# Patient Record
Sex: Male | Born: 1950 | Race: Black or African American | Hispanic: No | Marital: Married | Smoking: Former smoker
Health system: Southern US, Community
[De-identification: ages and names within clinical notes are randomized; demographics above are authoritative.]

## PROBLEM LIST (undated history)

## (undated) DIAGNOSIS — J449 Chronic obstructive pulmonary disease, unspecified: Secondary | ICD-10-CM

## (undated) DIAGNOSIS — I10 Essential (primary) hypertension: Secondary | ICD-10-CM

## (undated) DIAGNOSIS — E559 Vitamin D deficiency, unspecified: Secondary | ICD-10-CM

## (undated) DIAGNOSIS — L2082 Flexural eczema: Secondary | ICD-10-CM

## (undated) DIAGNOSIS — I739 Peripheral vascular disease, unspecified: Secondary | ICD-10-CM

## (undated) HISTORY — DX: Vitamin D deficiency, unspecified: E55.9

## (undated) HISTORY — DX: Flexural eczema: L20.82

## (undated) HISTORY — DX: Chronic obstructive pulmonary disease, unspecified: J44.9

## (undated) HISTORY — DX: Peripheral vascular disease, unspecified: I73.9

## (undated) HISTORY — DX: Essential (primary) hypertension: I10

---

## 2000-07-29 ENCOUNTER — Emergency Department (HOSPITAL_COMMUNITY): Admission: EM | Admit: 2000-07-29 | Discharge: 2000-07-29 | Payer: Self-pay | Admitting: Emergency Medicine

## 2000-08-08 ENCOUNTER — Encounter: Payer: Self-pay | Admitting: Emergency Medicine

## 2000-08-08 ENCOUNTER — Emergency Department (HOSPITAL_COMMUNITY): Admission: EM | Admit: 2000-08-08 | Discharge: 2000-08-08 | Payer: Self-pay | Admitting: Emergency Medicine

## 2000-08-10 ENCOUNTER — Ambulatory Visit (HOSPITAL_COMMUNITY): Admission: RE | Admit: 2000-08-10 | Discharge: 2000-08-10 | Payer: Self-pay | Admitting: General Surgery

## 2000-08-10 ENCOUNTER — Encounter: Payer: Self-pay | Admitting: General Surgery

## 2009-07-21 ENCOUNTER — Emergency Department (HOSPITAL_COMMUNITY): Admission: EM | Admit: 2009-07-21 | Discharge: 2009-07-21 | Payer: Self-pay | Admitting: Emergency Medicine

## 2009-11-16 ENCOUNTER — Emergency Department (HOSPITAL_COMMUNITY): Admission: EM | Admit: 2009-11-16 | Discharge: 2009-11-16 | Payer: Self-pay | Admitting: Emergency Medicine

## 2010-08-06 LAB — BASIC METABOLIC PANEL
BUN: 14 mg/dL (ref 6–23)
CO2: 25 mEq/L (ref 19–32)
Calcium: 9.2 mg/dL (ref 8.4–10.5)
Chloride: 105 mEq/L (ref 96–112)
Creatinine, Ser: 1.06 mg/dL (ref 0.4–1.5)
GFR calc Af Amer: >60 mL/min (ref >60)
GFR calc non Af Amer: >60 mL/min (ref >60)
Glucose, Bld: 92 mg/dL (ref 70–99)
Potassium: 3.9 mEq/L (ref 3.5–5.1)
Sodium: 141 mEq/L (ref 135–145)

## 2010-08-06 LAB — DIFFERENTIAL
Basophils Absolute: 0 K/uL (ref 0.0–0.1)
Basophils Relative: 1 % (ref 0–1)
Eosinophils Absolute: 0 10*3/uL (ref 0.0–0.7)
Eosinophils Relative: 1 % (ref 0–5)
Lymphocytes Relative: 43 % (ref 12–46)
Lymphs Abs: 3.4 K/uL (ref 0.7–4.0)
Monocytes Absolute: 0.7 K/uL (ref 0.1–1.0)
Monocytes Relative: 9 % (ref 3–12)
Neutro Abs: 3.7 K/uL (ref 1.7–7.7)
Neutrophils Relative %: 46 % (ref 43–77)

## 2010-08-06 LAB — CBC
HCT: 43.1 % (ref 39.0–52.0)
Hemoglobin: 14.8 g/dL (ref 13.0–17.0)
MCHC: 34.2 g/dL (ref 30.0–36.0)
MCV: 94.6 fL (ref 78.0–100.0)
Platelets: 177 K/uL (ref 150–400)
RBC: 4.56 MIL/uL (ref 4.22–5.81)
RDW: 13.2 % (ref 11.5–15.5)
WBC: 7.9 K/uL (ref 4.0–10.5)

## 2010-08-06 LAB — D-DIMER, QUANTITATIVE: D-Dimer, Quant: 0.33 ug{FEU}/mL (ref 0.00–0.48)

## 2010-08-06 LAB — POCT CARDIAC MARKERS

## 2010-09-28 ENCOUNTER — Inpatient Hospital Stay (INDEPENDENT_AMBULATORY_CARE_PROVIDER_SITE_OTHER)
Admission: RE | Admit: 2010-09-28 | Discharge: 2010-09-28 | Disposition: A | Discharge status: Home / Self Care | Payer: Self-pay | Source: Ambulatory Visit | Attending: Family Medicine | Admitting: Family Medicine

## 2010-09-28 DIAGNOSIS — L259 Unspecified contact dermatitis, unspecified cause: Secondary | ICD-10-CM

## 2012-01-02 ENCOUNTER — Ambulatory Visit (INDEPENDENT_AMBULATORY_CARE_PROVIDER_SITE_OTHER): Payer: Self-pay | Admitting: Family Medicine

## 2012-01-02 ENCOUNTER — Ambulatory Visit (HOSPITAL_COMMUNITY)
Admission: RE | Admit: 2012-01-02 | Discharge: 2012-01-02 | Disposition: A | Discharge status: Home / Self Care | Payer: Self-pay | Source: Ambulatory Visit

## 2012-01-02 ENCOUNTER — Encounter: Payer: Self-pay | Admitting: Family Medicine

## 2012-01-02 ENCOUNTER — Ambulatory Visit (HOSPITAL_COMMUNITY)
Admission: RE | Admit: 2012-01-02 | Discharge: 2012-01-02 | Disposition: A | Discharge status: Home / Self Care | Payer: Self-pay | Source: Ambulatory Visit | Attending: Family Medicine | Admitting: Family Medicine

## 2012-01-02 VITALS — BP 158/90 | HR 100 | Temp 98.4°F | Ht 69.0 in | Wt 151.0 lb

## 2012-01-02 DIAGNOSIS — R634 Abnormal weight loss: Secondary | ICD-10-CM

## 2012-01-02 DIAGNOSIS — J411 Mucopurulent chronic bronchitis: Secondary | ICD-10-CM

## 2012-01-02 DIAGNOSIS — Z720 Tobacco use: Secondary | ICD-10-CM | POA: Insufficient documentation

## 2012-01-02 DIAGNOSIS — F17200 Nicotine dependence, unspecified, uncomplicated: Secondary | ICD-10-CM | POA: Insufficient documentation

## 2012-01-02 DIAGNOSIS — R079 Chest pain, unspecified: Secondary | ICD-10-CM

## 2012-01-02 DIAGNOSIS — I517 Cardiomegaly: Secondary | ICD-10-CM | POA: Insufficient documentation

## 2012-01-02 DIAGNOSIS — L309 Dermatitis, unspecified: Secondary | ICD-10-CM | POA: Insufficient documentation

## 2012-01-02 DIAGNOSIS — L259 Unspecified contact dermatitis, unspecified cause: Secondary | ICD-10-CM

## 2012-01-02 DIAGNOSIS — I10 Essential (primary) hypertension: Secondary | ICD-10-CM

## 2012-01-02 DIAGNOSIS — R05 Cough: Secondary | ICD-10-CM | POA: Insufficient documentation

## 2012-01-02 DIAGNOSIS — R059 Cough, unspecified: Secondary | ICD-10-CM | POA: Insufficient documentation

## 2012-01-02 LAB — CBC WITH DIFFERENTIAL/PLATELET
HCT: 40.3 % (ref 39.0–52.0)
Hemoglobin: 13.4 g/dL (ref 13.0–17.0)
Lymphocytes Relative: 18 % (ref 12–46)
Lymphs Abs: 1.8 10*3/uL (ref 0.7–4.0)
MCHC: 33.3 g/dL (ref 30.0–36.0)
Monocytes Absolute: 1.1 10*3/uL — ABNORMAL HIGH (ref 0.1–1.0)
Monocytes Relative: 11 % (ref 3–12)
Neutro Abs: 6.9 10*3/uL (ref 1.7–7.7)
RBC: 4.19 MIL/uL — ABNORMAL LOW (ref 4.22–5.81)
WBC: 9.9 10*3/uL (ref 4.0–10.5)

## 2012-01-02 LAB — POCT GLYCOSYLATED HEMOGLOBIN (HGB A1C): Hemoglobin A1C: 5

## 2012-01-02 LAB — COMPREHENSIVE METABOLIC PANEL
ALT: 24 U/L (ref 0–53)
Albumin: 3.6 g/dL (ref 3.5–5.2)
CO2: 27 mEq/L (ref 19–32)
Glucose, Bld: 102 mg/dL — ABNORMAL HIGH (ref 70–99)
Potassium: 3.8 mEq/L (ref 3.5–5.3)
Sodium: 139 mEq/L (ref 135–145)
Total Protein: 6.7 g/dL (ref 6.0–8.3)

## 2012-01-02 LAB — TSH: TSH: 2.206 u[IU]/mL (ref 0.350–4.500)

## 2012-01-02 MED ORDER — TRIAMCINOLONE ACETONIDE 0.5 % EX OINT: TOPICAL_OINTMENT | Freq: Two times a day (BID) | CUTANEOUS | Status: DC

## 2012-01-02 MED ORDER — LISINOPRIL-HYDROCHLOROTHIAZIDE 10-12.5 MG PO TABS: 1.0000 | ORAL_TABLET | Freq: Every day | ORAL | Status: DC

## 2012-01-02 NOTE — Progress Notes (Signed)
Lawrence Sanford is a 61 y.o. who presents today for chronic cough x 1 month.   He states he has had a purulent cough x 1 month, that changed from clear to yellow at that time.  No increase in sputum production at that time.  No fever chills, but is having night sweats (drenching) x 3 months.  Has also had a 20 lbs weight loss in the past 2 years.  Has been smoking for about 40 yrs x 1 ppd.  Does deny Hemoptysis   Is having CP with coughing, but denies CP at rest or with ambulation.  Denies radiation into arm/jaw, nausea, vomiting, or having lightheaded, or dizziness, changes in vision, blurred vision, diplopia.  Denies HA, rhinorrhea, diarrhea, hematemesis, melena.   Does have Hx of PAD and gets claudication with about 2 blocks of activity.  Has been ongoing for over 10 years now, but has not seen a doctor for this other than an ED trip.   Also is having chronic eczema on the extensor surface for around one year now.  Is having worsening pruritis over the past month, along with spreading of his eczema.  Has tried emollients and hydrocortisone which has worked in the past but is getting little relief at the moment.    Past Medical History  Diagnosis Date  . PAD (peripheral artery disease)     History   Social History  . Marital Status: Married    Spouse Name: N/A    Number of Children: N/A  . Years of Education: N/A   Occupational History  . Not on file.   Social History Main Topics  . Smoking status: Current Everyday Smoker -- 1.0 packs/day for 40 years    Types: Cigarettes  . Smokeless tobacco: Not on file  . Alcohol Use: 1.5 oz/week    3 drink(s) per week  . Drug Use: No  . Sexually Active: Yes -- Male partner(s)   Other Topics Concern  . Not on file   Social History Narrative  . No narrative on file    Family History  Problem Relation Age of Onset  . Diabetes Mother   . Hypertension Mother   . Diabetes Father   . Alcohol abuse Father   . Hypertension Father   .  Diabetes Sister   . Hypertension Sister   . Stroke Brother   . Hypertension Brother     Current outpatient prescriptions:triamcinolone ointment (KENALOG) 0.5 %, Apply topically 2 (two) times daily., Disp: 30 g, Rfl: 0  Review of Systems - Per HPI  Physical Exam Filed Vitals:   01/02/12 1042  BP: 158/90  Pulse:   Temp:     Gen: NAD, Thin, cachectic looking HEENT: PERLA, EOMI, [place]/AT Neck: no JVD, thyroid supple  Cardio: irregular rhythm and rate , no murmurs, gallops, rub Lungs: Decreased BS B/L, no wheezing Abd: NABS, soft nontender nondistended MSK: ROM normal  Neuro: CN 2-12 intact, MS 5/5 B/L UE and LE, +2 patellar and achilles relfex b/l  Extremities: +1/4 pulses DP B/L, Clubbing B/L digits Psych: AAO x 3   No results found for this or any previous visit (from the past 72 hour(s)).

## 2012-01-02 NOTE — Assessment & Plan Note (Signed)
Will continue using creams as well as triamcinalone acetate 0.5% ointment (10 dollar ).  Will apply bid x 2 weeks and see how you are doing at your next visit.

## 2012-01-02 NOTE — Assessment & Plan Note (Signed)
Will get CXR as well as CBCAD (at return due to [facility] Card at that time) for possible Lung CA.  Most likely has underlying COPD (will need PFT and will look at CXR after it being done) for classification by Gold Standard.  Will treat today with prednisone 50 mg and azithromycin for possible COPD exacerbation ( Would like to give as well Spiriva/Atrovent and albuterol but pt does not have insurance and are both extremely expensive.

## 2012-01-02 NOTE — Patient Instructions (Signed)
Chronic Obstructive Pulmonary Disease Chronic obstructive pulmonary disease (COPD) is a condition in which airflow from the lungs is restricted. The lungs can never return to normal, but there are measures you can take which will improve them and make you feel better. CAUSES   Smoking.   Exposure to secondhand smoke.   Breathing in irritants (pollution, cigarette smoke, strong smells, aerosol sprays, paint fumes).   History of lung infections.  TREATMENT  Treatment focuses on making you comfortable (supportive care). Your caregiver may prescribe medications (inhaled or pills) to help improve your breathing. HOME CARE INSTRUCTIONS   If you smoke, stop smoking.   Avoid exposure to smoke, chemicals, and fumes that aggravate your breathing.   Take antibiotic medicines as directed by your caregiver.   Avoid medicines that dry up your system and slow down the elimination of secretions (antihistamines and cough syrups). This decreases respiratory capacity and may lead to infections.   Drink enough water and fluids to keep your urine clear or pale yellow. This loosens secretions.   Use humidifiers at home and at your bedside if they do not make breathing difficult.   Receive all protective vaccines your caregiver suggests, especially pneumococcal and influenza.   Use home oxygen as suggested.   Stay active. Exercise and physical activity will help maintain your ability to do things you want to do.   Eat a healthy diet.  SEEK MEDICAL CARE IF:   You develop pus-like mucus (sputum).   Breathing is more labored or exercise becomes difficult to do.   You are running out of the medicine you take for your breathing.  SEEK IMMEDIATE MEDICAL CARE IF:   You have a rapid heart rate.   You have agitation, confusion, tremors, or are in a stupor (family members may need to observe this).   It becomes difficult to breathe.   You develop chest pain.   You have a fever.  MAKE SURE YOU:    Understand these instructions.   Will watch your condition.   Will get help right away if you are not doing well or get worse.  Document Released: 02/06/2005 Document Revised: 04/18/2011 Document Reviewed: 06/29/2010 [facility] Patient Information 2012 [place], [place].  Smoking Cessation This document explains the best ways for you to quit smoking and new treatments to help. It lists new medicines that can double or triple your chances of quitting and quitting for good. It also considers ways to avoid relapses and concerns you may have about quitting, including weight gain. NICOTINE: A POWERFUL ADDICTION If you have tried to quit smoking, you know how hard it can be. It is hard because nicotine is a very addictive drug. For some people, it can be as addictive as heroin or cocaine. Usually, people make 2 or 3 tries, or more, before finally being able to quit. Each time you try to quit, you can learn about what helps and what hurts. Quitting takes hard work and a lot of effort, but you can quit smoking. QUITTING SMOKING IS ONE OF THE MOST IMPORTANT THINGS YOU WILL EVER DO.  You will live longer, feel better, and live better.   The impact on your body of quitting smoking is felt almost immediately:   Within 20 minutes, blood pressure decreases. Pulse returns to its normal level.   After 8 hours, carbon monoxide levels in the blood return to normal. Oxygen level increases.   After 24 hours, chance of heart attack starts to decrease. Breath, hair, and body stop  smelling like smoke.   After 48 hours, damaged nerve endings begin to recover. Sense of taste and smell improve.   After 72 hours, the body is virtually free of nicotine. Bronchial tubes relax and breathing becomes easier.   After 2 to 12 weeks, lungs can hold more air. Exercise becomes easier and circulation improves.   Quitting will reduce your risk of having a heart attack, stroke, cancer, or lung disease:   After 1 year, the  risk of coronary heart disease is cut in half.   After 5 years, the risk of stroke falls to the same as a nonsmoker.   After 10 years, the risk of lung cancer is cut in half and the risk of other cancers decreases significantly.   After 15 years, the risk of coronary heart disease drops, usually to the level of a nonsmoker.   If you are pregnant, quitting smoking will improve your chances of having a healthy baby.   The people you live with, especially your children, will be healthier.   You will have extra money to spend on things other than cigarettes.  FIVE KEYS TO QUITTING Studies have shown that these 5 steps will help you quit smoking and quit for good. You have the best chances of quitting if you use them together: 1. Get ready.  2. Get support and encouragement.  3. Learn new skills and behaviors.  4. Get medicine to reduce your nicotine addiction and use it correctly.  5. Be prepared for relapse or difficult situations. Be determined to continue trying to quit, even if you do not succeed at first.  1. GET READY  Set a quit date.   Change your environment.   Get rid of ALL cigarettes, ashtrays, matches, and lighters in your home, car, and place of work.   Do not let people smoke in your home.   Review your past attempts to quit. Think about what worked and what did not.   Once you quit, do not smoke. NOT EVEN A PUFF!  2. GET SUPPORT AND ENCOURAGEMENT Studies have shown that you have a better chance of being successful if you have help. You can get support in many ways.  Tell your family, friends, and coworkers that you are going to quit and need their support. Ask them not to smoke around you.   Talk to your caregivers (doctor, dentist, nurse, pharmacist, psychologist, and/or smoking counselor).   Get individual, group, or telephone counseling and support. The more counseling you have, the better your chances are of quitting. Programs are available at [employer]  and health centers. Call your local health department for information about programs in your area.   Spiritual beliefs and practices may help some smokers quit.   Quit meters are Photographer that keep track of quit statistics, such as amount of "quit-time," cigarettes not smoked, and money saved.   Many smokers find one or more of the many self-help books available useful in helping them quit and stay off tobacco.  3. LEARN NEW SKILLS AND BEHAVIORS  Try to distract yourself from urges to smoke. Talk to someone, go for a walk, or occupy your time with a task.   When you first try to quit, change your routine. Take a different route to work. Drink tea instead of coffee. Eat breakfast in a different place.   Do something to reduce your stress. Take a hot bath, exercise, or read a book.   Plan something  enjoyable to do every day. Reward yourself for not smoking.   Explore interactive web-based programs that specialize in helping you quit.  4. GET MEDICINE AND USE IT CORRECTLY Medicines can help you stop smoking and decrease the urge to smoke. Combining medicine with the above behavioral methods and support can quadruple your chances of successfully quitting smoking. The U.S. Food and Drug Administration (FDA) has approved 7 medicines to help you quit smoking. These medicines fall into 3 categories.  Nicotine replacement therapy (delivers nicotine to your body without the negative effects and risks of smoking):   Nicotine gum: Available over-the-counter.   Nicotine lozenges: Available over-the-counter.   Nicotine inhaler: Available by prescription.   Nicotine nasal spray: Available by prescription.   Nicotine skin patches (transdermal): Available by prescription and over-the-counter.   Antidepressant medicine (helps people abstain from smoking, but how this works is unknown):   Bupropion sustained-release (SR) tablets: Available by prescription.    Nicotinic receptor partial agonist (simulates the effect of nicotine in your brain):   Varenicline tartrate tablets: Available by prescription.   Ask your caregiver for advice about which medicines to use and how to use them. Carefully read the information on the package.   Everyone who is trying to quit may benefit from using a medicine. If you are pregnant or trying to become pregnant, nursing an infant, you are under age 37, or you smoke fewer than 10 cigarettes per day, talk to your caregiver before taking any nicotine replacement medicines.   You should stop using a nicotine replacement product and call your caregiver if you experience nausea, dizziness, weakness, vomiting, fast or irregular heartbeat, mouth problems with the lozenge or gum, or redness or swelling of the skin around the patch that does not go away.   Do not use any other product containing nicotine while using a nicotine replacement product.   Talk to your caregiver before using these products if you have diabetes, heart disease, asthma, stomach ulcers, you had a recent heart attack, you have high blood pressure that is not controlled with medicine, a history of irregular heartbeat, or you have been prescribed medicine to help you quit smoking.  5. BE PREPARED FOR RELAPSE OR DIFFICULT SITUATIONS  Most relapses occur within the first 3 months after quitting. Do not be discouraged if you start smoking again. Remember, most people try several times before they finally quit.   You may have symptoms of withdrawal because your body is used to nicotine. You may crave cigarettes, be irritable, feel very hungry, cough often, get headaches, or have difficulty concentrating.   The withdrawal symptoms are only temporary. They are strongest when you first quit, but they will go away within 10 to 14 days.  Here are some difficult situations to watch for:  Alcohol. Avoid drinking alcohol. Drinking lowers your chances of successfully  quitting.   Caffeine. Try to reduce the amount of caffeine you consume. It also lowers your chances of successfully quitting.   Other smokers. Being around smoking can make you want to smoke. Avoid smokers.   Weight gain. Many smokers will gain weight when they quit, usually less than 10 pounds. Eat a healthy diet and stay active. Do not let weight gain distract you from your main goal, quitting smoking. Some medicines that help you quit smoking may also help delay weight gain. You can always lose the weight gained after you quit.   Bad mood or depression. There are a lot of ways to improve your  mood other than smoking.  If you are having problems with any of these situations, talk to your caregiver. SPECIAL SITUATIONS AND CONDITIONS Studies suggest that everyone can quit smoking. Your situation or condition can give you a special reason to quit.  Pregnant women/new mothers: By quitting, you protect your baby's health and your own.   Hospitalized patients: By quitting, you reduce health problems and help healing.   Heart attack patients: By quitting, you reduce your risk of a second heart attack.   Lung, head, and neck cancer patients: By quitting, you reduce your chance of a second cancer.   Parents of children and adolescents: By quitting, you protect your children from illnesses caused by secondhand smoke.  QUESTIONS TO THINK ABOUT Think about the following questions before you try to stop smoking. You may want to talk about your answers with your caregiver.  Why do you want to quit?   If you tried to quit in the past, what helped and what did not?   What will be the most difficult situations for you after you quit? How will you plan to handle them?   Who can help you through the tough times? Your family? Friends? Caregiver?   What pleasures do you get from smoking? What ways can you still get pleasure if you quit?  Here are some questions to ask your caregiver:  How can you  help me to be successful at quitting?   What medicine do you think would be best for me and how should I take it?   What should I do if I need more help?   What is smoking withdrawal like? How can I get information on withdrawal?  Quitting takes hard work and a lot of effort, but you can quit smoking. FOR MORE INFORMATION  Smokefree.gov (http://www.davis-sullivan.com/) provides free, accurate, evidence-based information and professional assistance to help support the immediate and long-term needs of people trying to quit smoking. Document Released: 04/23/2001 Document Revised: 04/18/2011 Document Reviewed: 02/13/2009 [facility] Patient Information 2012 [place], [place].

## 2012-01-02 NOTE — Assessment & Plan Note (Signed)
Most likely secondary to chronic tobacco abuse as has LAE as well on EKG.  Recommended started aspirin as pt was previously dx with PAD as well.

## 2012-01-02 NOTE — Assessment & Plan Note (Signed)
Will start on HCTZ/Lisinopril today and recheck him in a week.  At that point, will go over labs with him for underlying issues.

## 2012-01-02 NOTE — Assessment & Plan Note (Signed)
Concern for Lung Ca.  Work up for this.  Will get HIV testing at next visit after pt has [employer]

## 2012-01-02 NOTE — Assessment & Plan Note (Signed)
Recommended Nicorette gum and counseled about saving money for vaca in the future.  Start slow and will recheck at next vsiit to see how he is doing.

## 2012-01-03 ENCOUNTER — Telehealth: Payer: Self-pay | Admitting: Family Medicine

## 2012-01-03 MED ORDER — TRIAMCINOLONE ACETONIDE 0.5 % EX CREA
TOPICAL_CREAM | Freq: Two times a day (BID) | CUTANEOUS | Status: AC
Start: 2012-01-03 — End: 2013-01-02

## 2012-01-03 NOTE — Telephone Encounter (Signed)
Will need to know which [facility] this was called to .

## 2012-01-03 NOTE — Telephone Encounter (Signed)
Wife is calling because the cost of the cream that was prescribed is too high for them.  She said that she was told that it would be on the $4 plan.  She doesn't know what to do.

## 2012-01-03 NOTE — Telephone Encounter (Addendum)
Spoke with Lawrence Sanford and she states cream was going to cost $30.00 and they cannot afford. States when MD sent in told them it would cost $10.00 but cost $30.00. Please prescribe something else cheaper or on $4.00 plan.  Went onto [employer] for [employer].  The triamcinolone cream 0.5% cream is on the list and has been e-Rx there.  Please let pt know that this has been called in and according to [facility] $4 Rx list, is one of the medications.   Thanks, [facility]

## 2012-01-06 ENCOUNTER — Ambulatory Visit: Payer: Self-pay | Admitting: Family Medicine

## 2012-01-21 ENCOUNTER — Ambulatory Visit (INDEPENDENT_AMBULATORY_CARE_PROVIDER_SITE_OTHER): Payer: Self-pay | Admitting: Family Medicine

## 2012-01-21 ENCOUNTER — Encounter: Payer: Self-pay | Admitting: Family Medicine

## 2012-01-21 VITALS — BP 170/90 | HR 82 | Temp 98.3°F | Ht 69.0 in | Wt 155.0 lb

## 2012-01-21 DIAGNOSIS — F17200 Nicotine dependence, unspecified, uncomplicated: Secondary | ICD-10-CM

## 2012-01-21 DIAGNOSIS — R634 Abnormal weight loss: Secondary | ICD-10-CM

## 2012-01-21 DIAGNOSIS — Z720 Tobacco use: Secondary | ICD-10-CM

## 2012-01-21 DIAGNOSIS — J411 Mucopurulent chronic bronchitis: Secondary | ICD-10-CM

## 2012-01-21 DIAGNOSIS — I10 Essential (primary) hypertension: Secondary | ICD-10-CM

## 2012-01-21 NOTE — Assessment & Plan Note (Signed)
Gained 4 lbs since last visit.  Will continue to monitor

## 2012-01-21 NOTE — Assessment & Plan Note (Signed)
Pt still having cough but has changed from yellow production to clear-white and has decreased in amount.  CXR done does not show infiltrate or masses.  CBC does not show elevation in white count.  Will await for pt to get [facility] and would like to continuing to follow.  Would hold off on the CT scan unless pt starts to have hemoptysis or consistent fevers.  Pt will need Colonoscopy in the future as well to r/o colon CA with possible mets.

## 2012-01-21 NOTE — Progress Notes (Signed)
Lawrence Sanford is a 61 y.o. who presents today for f/u of his HTN, weight loss, and smoking status.   His BP is 170/90 today and he is taking his medication as directed.  No dizziness, blurred vision, diplopia.  Is having increased frequency of urination.  Is still having cough.  Changed in consistency to thin with white color from thick with yellow color.  Decreased total production as well.  Did gain 4 lbs since last visit and his night sweats are 2 x per week instead of every night.   Smoking status is still smoking 1/2 ppd.  Cut in 1/2 from his ppd for 40 yrs.    Past Medical History  Diagnosis Date  . PAD (peripheral artery disease)     History   Social History  . Marital Status: Married    Spouse Name: N/A    Number of Children: N/A  . Years of Education: N/A   Occupational History  . Not on file.   Social History Main Topics  . Smoking status: Current Everyday Smoker -- 0.5 packs/day for 40 years    Types: Cigarettes  . Smokeless tobacco: Not on file  . Alcohol Use: 1.5 oz/week    3 drink(s) per week  . Drug Use: No  . Sexually Active: Yes -- Male partner(s)   Other Topics Concern  . Not on file   Social History Narrative  . No narrative on file    Family History  Problem Relation Age of Onset  . Diabetes Mother   . Hypertension Mother   . Diabetes Father   . Alcohol abuse Father   . Hypertension Father   . Diabetes Sister   . Hypertension Sister   . Stroke Brother   . Hypertension Brother     Current outpatient prescriptions:lisinopril-hydrochlorothiazide (PRINZIDE,ZESTORETIC) 10-12.5 MG per tablet, Take 1 tablet by mouth daily., Disp: 90 tablet, Rfl: 0;  triamcinolone cream (KENALOG) 0.5 %, Apply topically 2 (two) times daily., Disp: 15 g, Rfl: 11  Patient Information Form: Screening and ROS   Physical Exam Filed Vitals:   01/21/12 1410  BP: 170/90  Pulse: 82  Temp: 98.3 F (36.8 C)    Gen: NAD, Thin HEENT: [place]/AT Neck: no JVD, no  lymphadenopathy  Cardio: RRR, No murmurs/gallops/rubs Lungs: Increased expiration phase Abd: NABS, soft nontender nondistended MSK: ROM normal, + Clubbing of digits B/L Neuro: CN 2-12 intact Psych: AAO x 3    Chemistry      Component Value Date/Time   NA 139 01/02/2012 1153   K 3.8 01/02/2012 1153   CL 103 01/02/2012 1153   CO2 27 01/02/2012 1153   BUN 6 01/02/2012 1153   CREATININE 0.86 01/02/2012 1153   CREATININE 1.06 07/21/2009 1218      Component Value Date/Time   CALCIUM 9.4 01/02/2012 1153   ALKPHOS 96 01/02/2012 1153   AST 43* 01/02/2012 1153   ALT 24 01/02/2012 1153   BILITOT 1.0 01/02/2012 1153     Lab Results  Component Value Date   TSH 2.206 01/02/2012

## 2012-01-21 NOTE — Assessment & Plan Note (Signed)
Has decreased his tobacco use to 1/2 ppd.  Goal is to get to 7-8 cigarettes by next month's visit.

## 2012-01-21 NOTE — Assessment & Plan Note (Signed)
BP elevated to 170/90 today.  Has been taking Zestoretic 10-12.5 and will increase this to two pills in the AM.  Will see back in a month to reassess. HE would be a good canidate for Norvasc 5-10 mg qd.

## 2012-01-21 NOTE — Patient Instructions (Signed)
We saw you today for your blood pressure and your cough.  Your blood pressure is still high despite taking the zestoretic.  We will increase this so that you are taking two of these pills.    We will see you back in 1 month and reassess how your smoking is doing as well.

## 2012-02-17 ENCOUNTER — Ambulatory Visit: Payer: Self-pay | Admitting: Family Medicine

## 2012-09-23 ENCOUNTER — Other Ambulatory Visit: Payer: Self-pay | Admitting: *Deleted

## 2012-09-23 DIAGNOSIS — I10 Essential (primary) hypertension: Secondary | ICD-10-CM

## 2012-09-23 MED ORDER — LISINOPRIL-HYDROCHLOROTHIAZIDE 10-12.5 MG PO TABS: 1.0000 | ORAL_TABLET | Freq: Every day | ORAL | Status: DC

## 2012-11-16 ENCOUNTER — Emergency Department (INDEPENDENT_AMBULATORY_CARE_PROVIDER_SITE_OTHER)
Admission: EM | Admit: 2012-11-16 | Discharge: 2012-11-16 | Disposition: A | Discharge status: Home / Self Care | Payer: Self-pay | Source: Home / Self Care

## 2012-11-16 DIAGNOSIS — L259 Unspecified contact dermatitis, unspecified cause: Secondary | ICD-10-CM

## 2012-11-16 DIAGNOSIS — L309 Dermatitis, unspecified: Secondary | ICD-10-CM

## 2012-11-16 DIAGNOSIS — I10 Essential (primary) hypertension: Secondary | ICD-10-CM

## 2012-11-16 MED ORDER — TRIAMCINOLONE ACETONIDE 40 MG/ML IJ SUSP
40.0000 mg | Freq: Once | INTRAMUSCULAR | Status: AC
  Administered 2012-11-16: 40 mg via INTRAMUSCULAR

## 2012-11-16 MED ORDER — LISINOPRIL-HYDROCHLOROTHIAZIDE 10-12.5 MG PO TABS: 1.0000 | ORAL_TABLET | Freq: Every day | ORAL | Status: DC

## 2012-11-16 MED ORDER — TRIAMCINOLONE ACETONIDE 0.1 % EX CREA: TOPICAL_CREAM | Freq: Two times a day (BID) | CUTANEOUS | Status: DC

## 2012-11-16 MED ORDER — TRIAMCINOLONE ACETONIDE 40 MG/ML IJ SUSP
INTRAMUSCULAR | Status: AC
  Filled 2012-11-16: qty 1

## 2012-11-16 NOTE — ED Notes (Signed)
Patient states would like a refill on eczema cream and blood pressure medication.

## 2012-11-16 NOTE — ED Notes (Signed)
Patient tolerated medication well.

## 2012-11-16 NOTE — ED Provider Notes (Signed)
History    CSN: [id number] Arrival date & time 11/16/12  4098  None    Chief Complaint  Patient presents with  . Eczema  . Hypertension   (Consider location/radiation/quality/duration/timing/severity/associated sxs/prior Treatment) HPI Comments: 62 year old now here because he does not have a doctor and is requesting refills on chronic medication. He has a history of hypertension and eczema. Is requesting refills on Zestoretic and triamcinolone cream.  Past Medical History  Diagnosis Date  . PAD (peripheral artery disease)   . Hypertension    No past surgical history on file. Family History  Problem Relation Age of Onset  . Diabetes Mother   . Hypertension Mother   . Diabetes Father   . Alcohol abuse Father   . Hypertension Father   . Diabetes Sister   . Hypertension Sister   . Stroke Brother   . Hypertension Brother    History  Substance Use Topics  . Smoking status: Current Every Day Smoker -- 0.50 packs/day for 40 years    Types: Cigarettes  . Smokeless tobacco: Not on file  . Alcohol Use: 1.5 oz/week    3 drink(s) per week    Review of Systems  Constitutional: Negative for fever, diaphoresis and activity change.  HENT: Negative.   Respiratory: Negative.   Cardiovascular: Negative for chest pain and leg swelling.  Gastrointestinal: Negative.   Skin: Positive for rash.  Neurological: Negative.     Allergies  Review of patient's allergies indicates no known allergies.  Home Medications   Current Outpatient Rx  Name  Route  Sig  Dispense  Refill  . lisinopril-hydrochlorothiazide (PRINZIDE,ZESTORETIC) 10-12.5 MG per tablet   Oral   Take 1 tablet by mouth daily.   90 tablet   0   . lisinopril-hydrochlorothiazide (PRINZIDE,ZESTORETIC) 10-12.5 MG per tablet   Oral   Take 1 tablet by mouth daily.   30 tablet   0   . triamcinolone cream (KENALOG) 0.1 %   Topical   Apply topically 2 (two) times daily. Apply for 2 weeks. May use on face   30 g   0   . triamcinolone cream (KENALOG) 0.5 %   Topical   Apply topically 2 (two) times daily.   15 g   11    BP 147/69  Pulse 80  Temp(Src) 98.3 F (36.8 C) (Oral)  Resp 16  SpO2 100% Physical Exam  Nursing note and vitals reviewed. Constitutional: He is oriented to person, place, and time. He appears well-developed and well-nourished. No distress.  HENT:  Mouth/Throat: Oropharynx is clear and moist.  Neck: Normal range of motion. Neck supple.  Cardiovascular: Normal rate and normal heart sounds.   Pulmonary/Chest: Effort normal and breath sounds normal. No respiratory distress.  Musculoskeletal: He exhibits no edema and no tenderness.  Neurological: He is alert and oriented to person, place, and time. He exhibits normal muscle tone.  Skin: Rash noted.  Eczematoid rash covering many body surface areas to include bilateral upper extremities lesser to the face partly to the lower extremities and trunk.  Psychiatric: He has a normal mood and affect.    ED Course  Procedures (including critical care time) Labs Reviewed - No data to display No results found. 1. HTN (hypertension)   2. Eczema     MDM  Zestoretic 10/12.5 mg daily for blood pressure #30 no refills Kenalog 40 mg IM Triamcinolone cream apply to affected areas twice a day. Call the above number today to establish with a physician.  Hayden Rasmussen, NP 11/16/12 4540  Hayden Rasmussen, NP 11/16/12 [phone]

## 2012-11-16 NOTE — ED Provider Notes (Signed)
Medical screening examination/treatment/procedure(s) were performed by non-physician practitioner and as supervising physician I was immediately available for consultation/collaboration.  Leslee Home, M.D.  Reuben Likes, MD 11/16/12 [phone]

## 2015-07-14 ENCOUNTER — Emergency Department (HOSPITAL_COMMUNITY)
Admission: EM | Admit: 2015-07-14 | Discharge: 2015-07-14 | Disposition: A | Discharge status: Home / Self Care | Payer: Medicaid Other | Attending: Emergency Medicine | Admitting: Emergency Medicine

## 2015-07-14 ENCOUNTER — Encounter (HOSPITAL_COMMUNITY): Payer: Self-pay | Admitting: Emergency Medicine

## 2015-07-14 DIAGNOSIS — T22231A Burn of second degree of right upper arm, initial encounter: Secondary | ICD-10-CM | POA: Insufficient documentation

## 2015-07-14 DIAGNOSIS — Y9389 Activity, other specified: Secondary | ICD-10-CM | POA: Insufficient documentation

## 2015-07-14 DIAGNOSIS — T22031A Burn of unspecified degree of right upper arm, initial encounter: Secondary | ICD-10-CM | POA: Diagnosis present

## 2015-07-14 DIAGNOSIS — X19XXXA Contact with other heat and hot substances, initial encounter: Secondary | ICD-10-CM | POA: Insufficient documentation

## 2015-07-14 DIAGNOSIS — Z23 Encounter for immunization: Secondary | ICD-10-CM | POA: Diagnosis not present

## 2015-07-14 DIAGNOSIS — T22212A Burn of second degree of left forearm, initial encounter: Secondary | ICD-10-CM | POA: Insufficient documentation

## 2015-07-14 DIAGNOSIS — Y9289 Other specified places as the place of occurrence of the external cause: Secondary | ICD-10-CM | POA: Insufficient documentation

## 2015-07-14 DIAGNOSIS — Y998 Other external cause status: Secondary | ICD-10-CM | POA: Diagnosis not present

## 2015-07-14 DIAGNOSIS — F17210 Nicotine dependence, cigarettes, uncomplicated: Secondary | ICD-10-CM | POA: Diagnosis not present

## 2015-07-14 DIAGNOSIS — T2123XA Burn of second degree of upper back, initial encounter: Secondary | ICD-10-CM | POA: Diagnosis not present

## 2015-07-14 DIAGNOSIS — Z79899 Other long term (current) drug therapy: Secondary | ICD-10-CM | POA: Diagnosis not present

## 2015-07-14 DIAGNOSIS — T300 Burn of unspecified body region, unspecified degree: Secondary | ICD-10-CM

## 2015-07-14 DIAGNOSIS — I10 Essential (primary) hypertension: Secondary | ICD-10-CM | POA: Diagnosis not present

## 2015-07-14 MED ORDER — TETANUS-DIPHTH-ACELL PERTUSSIS 5-2.5-18.5 LF-MCG/0.5 IM SUSP
0.5000 mL | Freq: Once | INTRAMUSCULAR | Status: AC
  Administered 2015-07-14: 0.5 mL via INTRAMUSCULAR
  Filled 2015-07-14: qty 0.5

## 2015-07-14 MED ORDER — SILVER SULFADIAZINE 1 % EX CREA
TOPICAL_CREAM | Freq: Once | CUTANEOUS | Status: AC
  Administered 2015-07-14: 10:00:00 via TOPICAL
  Filled 2015-07-14: qty 85

## 2015-07-14 NOTE — Discharge Instructions (Signed)
Clean the burns with soap and water once each day, then apply a light dressing with Silvadene. Take ibuprofen or Tylenol for pain.  Burn Care Your skin is a natural barrier to infection. It is the largest organ of your body. Burns damage this natural protection. To help prevent infection, it is very important to follow your caregiver's instructions in the care of your burn. Burns are classified as:  First degree. There is only redness of the skin (erythema). No scarring is expected.  Second degree. There is blistering of the skin. Scarring may occur with deeper burns.  Third degree. All layers of the skin are injured, and scarring is expected. HOME CARE INSTRUCTIONS   Wash your hands well before changing your bandage.  Change your bandage as often as directed by your caregiver.  Remove the old bandage. If the bandage sticks, you may soak it off with cool, clean water.  Cleanse the burn thoroughly but gently with mild soap and water.  Pat the area dry with a clean, dry cloth.  Apply a thin layer of antibacterial cream to the burn.  Apply a clean bandage as instructed by your caregiver.  Keep the bandage as clean and dry as possible.  Elevate the affected area for the first 24 hours, then as instructed by your caregiver.  Only take over-the-counter or prescription medicines for pain, discomfort, or fever as directed by your caregiver. SEEK IMMEDIATE MEDICAL CARE IF:   You develop excessive pain.  You develop redness, tenderness, swelling, or red streaks near the burn.  The burned area develops yellowish-white fluid (pus) or a bad smell.  You have a fever. MAKE SURE YOU:   Understand these instructions.  Will watch your condition.  Will get help right away if you are not doing well or get worse.   This information is not intended to replace advice given to you by your health care provider. Make sure you discuss any questions you have with your health care provider.     Document Released: 04/29/2005 Document Revised: 07/22/2011 Document Reviewed: 09/19/2010 Elsevier Interactive Patient Education [employer].

## 2015-07-14 NOTE — ED Notes (Signed)
Pt comfortable with discharge and follow up instructions. Pt declines wheelchair, escorted to waiting area by this RN. No prescriptions. 
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
## 2016-05-23 ENCOUNTER — Encounter: Payer: Self-pay | Admitting: *Deleted

## 2016-05-24 ENCOUNTER — Institutional Professional Consult (permissible substitution): Payer: Medicaid Other | Admitting: Pulmonary Disease

## 2016-07-11 ENCOUNTER — Ambulatory Visit (INDEPENDENT_AMBULATORY_CARE_PROVIDER_SITE_OTHER): Payer: Medicare HMO | Admitting: Pulmonary Disease

## 2016-07-11 ENCOUNTER — Ambulatory Visit (INDEPENDENT_AMBULATORY_CARE_PROVIDER_SITE_OTHER)
Admission: RE | Admit: 2016-07-11 | Discharge: 2016-07-11 | Disposition: A | Discharge status: Home / Self Care | Payer: Medicare HMO | Source: Ambulatory Visit | Attending: Pulmonary Disease | Admitting: Pulmonary Disease

## 2016-07-11 ENCOUNTER — Telehealth: Payer: Self-pay | Admitting: Pulmonary Disease

## 2016-07-11 ENCOUNTER — Encounter: Payer: Self-pay | Admitting: Pulmonary Disease

## 2016-07-11 VITALS — BP 124/76 | HR 112 | Ht 69.0 in | Wt 128.8 lb

## 2016-07-11 DIAGNOSIS — J42 Unspecified chronic bronchitis: Secondary | ICD-10-CM

## 2016-07-11 DIAGNOSIS — R05 Cough: Secondary | ICD-10-CM | POA: Diagnosis not present

## 2016-07-11 DIAGNOSIS — R9389 Abnormal findings on diagnostic imaging of other specified body structures: Secondary | ICD-10-CM

## 2016-07-11 MED ORDER — BUDESONIDE-FORMOTEROL FUMARATE 80-4.5 MCG/ACT IN AERO: 2.0000 | INHALATION_SPRAY | Freq: Two times a day (BID) | RESPIRATORY_TRACT | 0 refills | Status: DC

## 2016-07-11 MED ORDER — BUDESONIDE-FORMOTEROL FUMARATE 80-4.5 MCG/ACT IN AERO: 2.0000 | INHALATION_SPRAY | Freq: Two times a day (BID) | RESPIRATORY_TRACT | 3 refills | Status: AC

## 2016-07-11 NOTE — Telephone Encounter (Signed)
Spoke with pt. He is aware of his CXR results. CT order has been placed. Nothing further was needed.

## 2016-07-11 NOTE — Telephone Encounter (Signed)
Let the patient know that the CXR is abnormal and we need to follow up with CT chest. Order CT chest without contrast.  PM

## 2016-07-11 NOTE — Telephone Encounter (Signed)
lmtcb x1 for pt. 

## 2016-07-11 NOTE — Progress Notes (Signed)
Patient seen in the office today and instructed on use of Symbicort 80.  Patient expressed understanding and demonstrated technique. Benetta Spar [facility] 07/11/16

## 2016-07-11 NOTE — Progress Notes (Signed)
Ryelan Levengood    [id number]    Jan 10, 1951  Primary Care Physician:No primary care provider on file.  Referring Physician: Janora Norlander, DO [address], [place] [address]  Chief complaint:  Consult for management of COPD GOLD B  HPI: 66 year old with past medical history of hypertension, COPD (CAT sore 21, 1 exacerbation over past year), current smoker. He was seen by his primary care in November of 2017 for hemoptysis, chronic bronchitis. He was given a course of Levaquin and a chest x-ray obtained. I do not have the report of tests to review.   He does not report any recurrence of hemoptysis. He has chronic issues with cough, sputum production, chest congestion. He has daily symptoms of dyspnea on exertion and at rest associated with wheezing. He is not on any inhalers He has about 20-pack-year smoking history. He quit briefly last year when he was given Chantix but now is back to smoking half pack a day. He used to work in [employer] and reports exposure to asbestos in his line of work.  Outpatient Encounter Prescriptions as of 07/11/2016  Medication Sig  . D3-50 50000 units capsule   . lisinopril-hydrochlorothiazide (PRINZIDE,ZESTORETIC) 10-12.5 MG per tablet Take 1 tablet by mouth daily.  Marland Kitchen losartan-hydrochlorothiazide (HYZAAR) 50-12.5 MG tablet   . triamcinolone cream (KENALOG) 0.1 % Apply topically 2 (two) times daily. Apply for 2 weeks. May use on face  . [DISCONTINUED] lisinopril-hydrochlorothiazide (PRINZIDE,ZESTORETIC) 10-12.5 MG per tablet Take 1 tablet by mouth daily.   No facility-administered encounter medications on file as of 07/11/2016.     Allergies as of 07/11/2016  . (No Known Allergies)    Past Medical History:  Diagnosis Date  . COPD (chronic obstructive pulmonary disease) ([place])   . Flexural eczema   . Hypertension   . PAD (peripheral artery disease) ([place])   . Vitamin D deficiency     No past surgical history on  file.  Family History  Problem Relation Age of Onset  . Diabetes Mother   . Hypertension Mother   . Diabetes Father   . Alcohol abuse Father   . Hypertension Father   . Diabetes Sister   . Hypertension Sister   . Stroke Brother   . Hypertension Brother     Social History   Social History  . Marital status: Married    Spouse name: N/A  . Number of children: N/A  . Years of education: N/A   Occupational History  . Not on file.   Social History Main Topics  . Smoking status: Current Every Day Smoker    Packs/day: 0.50    Years: 40.00    Types: Cigarettes  . Smokeless tobacco: Never Used  . Alcohol use 1.5 oz/week    3 Standard drinks or equivalent per week  . Drug use: Yes  . Sexual activity: Yes    Partners: Female   Other Topics Concern  . Not on file   Social History Narrative  . No narrative on file    Review of systems: Review of Systems  Constitutional: Negative for fever and chills.  HENT: Negative.   Eyes: Negative for blurred vision.  Respiratory: as per HPI  Cardiovascular: Negative for chest pain and palpitations.  Gastrointestinal: Negative for vomiting, diarrhea, blood per rectum. Genitourinary: Negative for dysuria, urgency, frequency and hematuria.  Musculoskeletal: Negative for myalgias, back pain and joint pain.  Skin: Negative for itching and rash.  Neurological:  Negative for dizziness, tremors, focal weakness, seizures and loss of consciousness.  Endo/Heme/Allergies: Negative for environmental allergies.  Psychiatric/Behavioral: Negative for depression, suicidal ideas and hallucinations.  All other systems reviewed and are negative.  Physical Exam: Blood pressure 124/76, pulse (!) 112, height 5\' 9"  (1.753 m), weight 58.4 kg (128 lb 12.8 oz), SpO2 95 %. Gen:      No acute distress HEENT:  EOMI, sclera anicteric Neck:     No masses; no thyromegaly Lungs:    Clear to auscultation bilaterally; normal respiratory effort CV:         Regular  rate and rhythm; no murmurs Abd:      + bowel sounds; soft, non-tender; no palpable masses, no distension Ext:    No edema; adequate peripheral perfusion Skin:      Warm and dry; no rash Neuro: alert and oriented x 3 Psych: normal mood and affect  Data Reviewed: CXR 01/02/12- no active cardiopulmonary process. Mild peribronchial thickening. I have reviewed all images personally  Assessment:  COPD. Chronic bronchitis Likely GOLD B stage we'll get a chest x-ray today and try to obtain records of old lung imaging from his primary care office. His episode of minor hemoptysis last year may be related to chronic bronchitis and appears to have resolved now.   He will get scheduled for pulmonary function tests and started on Symbicort 80/4.5.  Active smoker We discussed smoking cessation. He had previously quit on Chantix but has relapsed now. He is interested in in quitting on his own and plans on using nicotine gum. Time spent counseling-5 minutes.  He'll get referred for low-dose screening CTs of the chest.  Plan/Recommendations: - CXR, PFTs - Start Symbicort 80/4.5 - Referral for low dose screening CT of chest.  Marshell Garfinkel MD [facility] Pager [phone] 07/11/2016, 9:31 AM  CC: Janora Norlander, DO

## 2016-07-11 NOTE — Telephone Encounter (Signed)
Received call report for pt's CXR that was done today 07/11/16. The impression is below.  Dr. Vaughan Browner please advise. Thanks.   IMPRESSION: Abnormal appearance of both lungs greatest on the left. The findings is are worrisome for pneumonia possibly tuberculosis or atypical etiologies. Underlying malignancy is not excluded. Close clinical and laboratory correlation is needed. Chest CT scanning would be a useful next imaging step.

## 2016-07-11 NOTE — Patient Instructions (Signed)
We get a chest x-ray and schedule for pulmonary function tests Start on Symbicort 80/4.5 Will refer you for low-dose screening CT of the chest. Continue working on smoking cessation  Return to clinic in 1-2 months.

## 2016-07-12 ENCOUNTER — Other Ambulatory Visit: Payer: Medicare HMO

## 2016-07-15 ENCOUNTER — Ambulatory Visit (INDEPENDENT_AMBULATORY_CARE_PROVIDER_SITE_OTHER)
Admission: RE | Admit: 2016-07-15 | Discharge: 2016-07-15 | Disposition: A | Discharge status: Home / Self Care | Payer: Medicare HMO | Source: Ambulatory Visit | Attending: Pulmonary Disease | Admitting: Pulmonary Disease

## 2016-07-15 DIAGNOSIS — R938 Abnormal findings on diagnostic imaging of other specified body structures: Secondary | ICD-10-CM | POA: Diagnosis not present

## 2016-07-15 DIAGNOSIS — J189 Pneumonia, unspecified organism: Secondary | ICD-10-CM | POA: Diagnosis not present

## 2016-07-15 DIAGNOSIS — R9389 Abnormal findings on diagnostic imaging of other specified body structures: Secondary | ICD-10-CM

## 2016-07-16 ENCOUNTER — Telehealth: Payer: Self-pay | Admitting: Pulmonary Disease

## 2016-07-16 DIAGNOSIS — J411 Mucopurulent chronic bronchitis: Secondary | ICD-10-CM

## 2016-07-16 MED ORDER — AMOXICILLIN-POT CLAVULANATE 875-125 MG PO TABS: 1.0000 | ORAL_TABLET | Freq: Two times a day (BID) | ORAL | 0 refills | Status: DC

## 2016-07-16 NOTE — Telephone Encounter (Signed)
Spoke with pt. He is aware of results. Order has been placed per PM. Rx has been sent in per PM. Nothing further was needed.

## 2016-07-16 NOTE — Telephone Encounter (Signed)
Let him know that the CT shows cavity and pneumonia. Please order Augmetin 875 mg bid for 21 days. Repeat CXR at end of treatment. Ask him to give sputum for regular cultures, AFB and fungus X 3 samples  He may need a bronch if we are unable to get good sputum specimens.  PM

## 2016-07-16 NOTE — Telephone Encounter (Signed)
Received a call report from Opal Sidles at [facility]. Pt's CT chest has come back.  IMPRESSION: 1. Right upper lobe cavitary pneumonia traversing major fissures into the superior segment of right lower lobe. Additional finding of bronchiolitis and consolidation. Evaluation for pulmonary tuberculosis and atypical mycobacterium is recommended. 2. Severe coronary artery calcification. 3. Aortic atherosclerosis.  PM - please advise. Thanks.

## 2016-07-31 DIAGNOSIS — J449 Chronic obstructive pulmonary disease, unspecified: Secondary | ICD-10-CM | POA: Diagnosis not present

## 2016-07-31 DIAGNOSIS — E559 Vitamin D deficiency, unspecified: Secondary | ICD-10-CM | POA: Diagnosis not present

## 2016-07-31 DIAGNOSIS — I10 Essential (primary) hypertension: Secondary | ICD-10-CM | POA: Diagnosis not present

## 2016-08-19 DIAGNOSIS — I10 Essential (primary) hypertension: Secondary | ICD-10-CM | POA: Diagnosis not present

## 2016-08-19 DIAGNOSIS — J449 Chronic obstructive pulmonary disease, unspecified: Secondary | ICD-10-CM | POA: Diagnosis not present

## 2016-08-20 ENCOUNTER — Telehealth: Payer: Self-pay | Admitting: Pulmonary Disease

## 2016-08-20 DIAGNOSIS — J411 Mucopurulent chronic bronchitis: Secondary | ICD-10-CM

## 2016-08-20 NOTE — Telephone Encounter (Signed)
lmtcb for pt.  

## 2016-08-21 NOTE — Telephone Encounter (Signed)
lmtcb x2 for pt. 

## 2016-08-22 NOTE — Telephone Encounter (Signed)
Ask him to give sputum for regular cultures, AFB and fungus X 3 samples. Repeat chest x ray. He will need to be seen in office and assessed for bronchoscopy. Please schedule.

## 2016-08-22 NOTE — Telephone Encounter (Signed)
Called and spoke with pt and he stated that he is still coughing up white spututm. He did finish the abx.  Pt stated that he is feeling a lot better but no energy.  Pt wanted to make sure that nothing further was needed.  PM please advise. Thanks  No Known Allergies

## 2016-08-22 NOTE — Telephone Encounter (Signed)
lmtcb X1 for pt. Sputum culture orders and cxr order placed. Pt needs to be scheduled for an acute visit.

## 2016-08-23 NOTE — Telephone Encounter (Signed)
Patient wife returning call - she can be reached at [phone]

## 2016-08-23 NOTE — Telephone Encounter (Signed)
Called and spoke with pt and he is aware of appt on Monday at 1015 with SG.  Pt is aware to come in earlier for cxr and sputum cx.  Nothing further is needed.

## 2016-08-23 NOTE — Telephone Encounter (Signed)
lmtcb X2 for pt.  

## 2016-08-26 ENCOUNTER — Other Ambulatory Visit: Payer: Medicare HMO

## 2016-08-26 ENCOUNTER — Encounter: Payer: Self-pay | Admitting: Acute Care

## 2016-08-26 ENCOUNTER — Ambulatory Visit (INDEPENDENT_AMBULATORY_CARE_PROVIDER_SITE_OTHER): Payer: Medicare HMO | Admitting: Acute Care

## 2016-08-26 ENCOUNTER — Telehealth: Payer: Self-pay | Admitting: Acute Care

## 2016-08-26 ENCOUNTER — Ambulatory Visit (INDEPENDENT_AMBULATORY_CARE_PROVIDER_SITE_OTHER)
Admission: RE | Admit: 2016-08-26 | Discharge: 2016-08-26 | Disposition: A | Discharge status: Home / Self Care | Payer: Medicare HMO | Source: Ambulatory Visit | Attending: Pulmonary Disease | Admitting: Pulmonary Disease

## 2016-08-26 DIAGNOSIS — J984 Other disorders of lung: Secondary | ICD-10-CM

## 2016-08-26 DIAGNOSIS — F17210 Nicotine dependence, cigarettes, uncomplicated: Secondary | ICD-10-CM

## 2016-08-26 DIAGNOSIS — J411 Mucopurulent chronic bronchitis: Secondary | ICD-10-CM

## 2016-08-26 DIAGNOSIS — R05 Cough: Secondary | ICD-10-CM | POA: Diagnosis not present

## 2016-08-26 DIAGNOSIS — Z720 Tobacco use: Secondary | ICD-10-CM

## 2016-08-26 NOTE — Assessment & Plan Note (Signed)
Continued tobacco abuse Plan: Counseled to quit smoking Be Stronger than your excuses card given to patient with community resources to assist in smoking cessation. Offered help in any way we can provide to assist  with smoking cessation.

## 2016-08-26 NOTE — Assessment & Plan Note (Signed)
Continue Symbicort twice daily Remember to rinse mouth after use.

## 2016-08-26 NOTE — Assessment & Plan Note (Addendum)
Cavitary lesion noted on CXR 07/11/2016 Treated with Augmentin x 21 days per Dr. Vaughan Browner Sputum cultures for AFB and Fungal ordered 07/11/2016, but never collected. CXR with stable cavitary lesion after Augmentin x 21 days Pt. Is a current smoker with recent weight loss>> concern for malignancy. Plan: We will get a CXR today >> Stable appearing inflammatory changes and cavitary lesion in the right lung. We will call with CXR results. We will collect sputum today. We will need to collect 3 sputum specimans. Mucinex 1200 mg once daily with a full glass of water ( OK to use generic) for chest congestion Delsym cough syrup for cough. 1 teaspoon every 12 hours. If we are unable to get sputum samples, we will need to schedule a procedure to allow [place] to collect sputum called a bronchoscopy. Try over the counter decongestant for your ear congestion. Please work on quitting smoking.This is very important. I have provided you with the " Be stronger than your excuses " card with community resources to assist with smoking cessation. Schedule bronch with Dr. Vaughan Browner at first available time. Follow up in 1 month with Dr. Vaughan Browner Please contact office for sooner follow up if symptoms do not improve or worsen or seek emergency care

## 2016-08-26 NOTE — Telephone Encounter (Signed)
Please call patient and let him know that I spoke with Dr. Vaughan Browner and we feel it is best at this point to proceed with a bronchoscopy. Let him know that his CXR today showed a stable cavitary area in the right lung, as  was seen I the CXR 3 weeks ago. Please order and schedule patient at [facility] for [place] with Dr. Vaughan Browner at first available opportunity. Thanks so much.

## 2016-08-26 NOTE — Telephone Encounter (Signed)
PM  Please Advise-  Please see SG's previous message, but for the bronch- do you want fluoro, is the pt TB risk, and date and dx.

## 2016-08-26 NOTE — Progress Notes (Signed)
History of Present Illness Lawrence Sanford is a 66 y.o. male current smoker ( 40 pack year smoking history) with COPD ( CAT score 21, 1 exacerbation over the last year). He is followed by Dr. Vaughan Sanford.  Synopsis: 66 year old with past medical history of hypertension, COPD (CAT sore 21, 1 exacerbation over past year), current smoker. He was seen by his primary care in November of 2017 for hemoptysis, chronic bronchitis. He was given a course of Levaquin and a chest x-ray obtained. I do not have the report of tests to review.   He does not report any recurrence of hemoptysis. He has chronic issues with cough, sputum production, chest congestion. He has daily symptoms of dyspnea on exertion and at rest associated with wheezing. He is not on any inhalers He has about 20-pack-year smoking history. He quit briefly last year when he was given Chantix but now is back to smoking half pack a day. He used to work in [employer] and reports exposure to asbestos in his line of work.      Outpatient Encounter Prescriptions as of 07/11/2016    08/26/2016 Follow up appointment: Pt. Presents for follow up. He was seen by Dr.Mannam 07/11/2016. For worsening shortness of breath and cough. CXR was done which indicated results that were worrisome for or pneumonia possibly tuberculosis or atypical etiologies. Underlying malignancy is not excluded.CT scan was then ordered, which indicated cavitary pneumonia. He was prescribed Augmentin x 21 days, which he states he completed. He returns today for CXR and to collect sputum specimans for AFB and Fungus. We will need 3 specimens total. Clinically he states he is feeling better, but complains of weakness and some intermittent shortness of breath.He is compliant with his Symbicort twice daily.He denies fever, chest pain, orthopnea or hemoptysis. He states he did have a night sweat for the first time last night. It woke him up form sleep.   I spent 4 minutes of today's  visit discussing smoking cessation counseling.   Tests  CXR 08/26/2016: FINDINGS: Cardiac shadow is within normal limits. Diffuse inflammatory changes are noted throughout the right lung similar to that seen on prior examination with evidence of a large cavitary lesion predominately within the right upper lobe. No sizable effusion is seen. No new focal abnormality is noted. No bony abnormality is seen.  IMPRESSION: Stable appearing inflammatory changes and cavitary lesion in the right lung.   CXR 07/11/2016 IMPRESSION: Abnormal appearance of both lungs greatest on the left. The findings is are worrisome for pneumonia possibly tuberculosis or atypical etiologies. Underlying malignancy is not excluded.  CT Chest 07/15/2016: IMPRESSION: 1. Right upper lobe cavitary pneumonia traversing major fissures into the superior segment of right lower lobe. Additional finding of bronchiolitis and consolidation. Evaluation for pulmonary tuberculosis and atypical mycobacterium is recommended. 2. Severe coronary artery calcification. 3. Aortic atherosclerosis.  Past medical hx Past Medical History:  Diagnosis Date  . COPD (chronic obstructive pulmonary disease) ([place])   . Flexural eczema   . Hypertension   . PAD (peripheral artery disease) ([place])   . Vitamin D deficiency      Past surgical hx, Family hx, Social hx all reviewed.  Current Outpatient Prescriptions on File Prior to Visit  Medication Sig  . budesonide-formoterol (SYMBICORT) 80-4.5 MCG/ACT inhaler Inhale 2 puffs into the lungs 2 (two) times daily.  . D3-50 50000 units capsule   . losartan-hydrochlorothiazide (HYZAAR) 50-12.5 MG tablet   . triamcinolone cream (KENALOG) 0.1 % Apply topically 2 (two) times  daily. Apply for 2 weeks. May use on face  . lisinopril-hydrochlorothiazide (PRINZIDE,ZESTORETIC) 10-12.5 MG per tablet Take 1 tablet by mouth daily. (Patient not taking: Reported on 08/26/2016)   No current  facility-administered medications on file prior to visit.      No Known Allergies  Review Of Systems:  Constitutional:   +  weight loss, + night sweats, No  Fevers, chills, fatigue, or  lassitude.  HEENT:   No headaches,  Difficulty swallowing,  Tooth/dental problems, or  Sore throat,                No sneezing, itching, ear ache, + nasal congestion, no post nasal drip,   CV:  No chest pain,  Orthopnea, PND, swelling in lower extremities, anasarca, dizziness, palpitations, syncope.   GI  No heartburn, indigestion, abdominal pain, nausea, vomiting, diarrhea, change in bowel habits, loss of appetite, bloody stools.   Resp: +  shortness of breath with exertion or at rest.  + excess mucus, + productive cough,  No non-productive cough,  No coughing up of blood.  No change in color of mucus.  No wheezing.  No chest wall deformity  Skin: no rash or lesions.  GU: no dysuria, change in color of urine, no urgency or frequency.  No flank pain, no hematuria   MS:  No joint pain or swelling.  No decreased range of motion.  No back pain.  Psych:  No change in mood or affect. No depression or anxiety.  No memory loss.   Vital Signs BP 100/60 (BP Location: Right Arm, Cuff Size: Normal)   Pulse 92   Temp 97.4 F (36.3 C) (Oral)   Ht 5\' 9"  (1.753 m)   Wt 126 lb 9.6 oz (57.4 kg)   SpO2 95%   BMI 18.70 kg/m    Physical Exam:  General- No distress,  A&Ox3, thin, elderly male wearing a face mask. ENT: No sinus tenderness, TM clear, pale nasal mucosa, no oral exudate,no post nasal drip, no LAN Cardiac: S1, S2, regular rate and rhythm, no murmur Chest: No wheeze/ rales/ dullness; no accessory muscle use, no nasal flaring, no sternal retractions, breath sounds diminished per bases. Abd.: Soft Non-tender Ext: No clubbing cyanosis, edema Neuro:  Deconditioned, alert and oriented x 3, MAE x 4. Skin: No rashes, warm and dry Psych: normal mood and behavior   Assessment/Plan  Cavitary lesion  of lung Cavitary lesion noted on CXR 07/11/2016 Treated with Augmentin x 21 days per Dr. Vaughan Sanford Sputum cultures for AFB and Fungal ordered 07/11/2016, but never collected. CXR with stable cavitary lesion after Augmentin x 21 days Pt. Is a current smoker with recent weight loss>> concern for malignancy. Plan: We will get a CXR today >> Stable appearing inflammatory changes and cavitary lesion in the right lung. We will call with CXR results. We will collect sputum today. We will need to collect 3 sputum specimans. Mucinex 1200 mg once daily with a full glass of water ( OK to use generic) for chest congestion Delsym cough syrup for cough. 1 teaspoon every 12 hours. If we are unable to get sputum samples, we will need to schedule a procedure to allow [place] to collect sputum called a bronchoscopy. Try over the counter decongestant for your ear congestion. Please work on quitting smoking.This is very important. I have provided you with the " Be stronger than your excuses " card with community resources to assist with smoking cessation. Schedule bronch with Dr. Vaughan Sanford at first available time.  Follow up in 1 month with Dr. Vaughan Sanford Please contact office for sooner follow up if symptoms do not improve or worsen or seek emergency care     Tobacco abuse Continued tobacco abuse Plan: Counseled to quit smoking Be Stronger than your excuses card given to patient with community resources to assist in smoking cessation. Offered help in any way we can provide to assist  with smoking cessation.  Chronic bronchitis with productive mucopurulent cough Continue Symbicort twice daily Remember to rinse mouth after use.    Magdalen Spatz, NP 08/26/2016  3:13 PM

## 2016-08-26 NOTE — Patient Instructions (Addendum)
It is nice to see you today. We will get a CXR today We will call with CXR results. We will collect sputum today. We will need to collect 3 sputum specimans. Mucinex 1200 mg once daily with a full glass of water ( OK to use generic) Delsym cough syrup for cough. 1 teaspoon every 12 hours. If we are unable to get sputum samples, we will need to schedule a procedure to allow [place] to collect sputum called a bronchoscopy. Try over the counter decongestant for your ear congestion. Please work on quitting smoking.This is very important. I have provided you with the " Be stronger than your excuses " card with community resources to assist with smoking cessation. Schedule bronch with Dr. Vaughan Browner at first available time. Follow up in 1 month with Dr. Vaughan Browner Please contact office for sooner follow up if symptoms do not improve or worsen or seek emergency care

## 2016-08-27 NOTE — Telephone Encounter (Signed)
Check with [name]. I gave her instructions about the procedure. Thanks  PM

## 2016-08-27 NOTE — Telephone Encounter (Signed)
Please See Dr. Matilde Bash previous message

## 2016-08-27 NOTE — Telephone Encounter (Signed)
Bronch has been scheduled for 08/29/16 10:00am @ [facility]. Pt aware and voiced his understanding. Nothing further needed.

## 2016-08-28 ENCOUNTER — Encounter (HOSPITAL_COMMUNITY): Payer: Self-pay

## 2016-08-29 ENCOUNTER — Encounter (HOSPITAL_COMMUNITY): Payer: Self-pay | Admitting: Respiratory Therapy

## 2016-08-29 ENCOUNTER — Ambulatory Visit (HOSPITAL_COMMUNITY)
Admission: RE | Admit: 2016-08-29 | Discharge: 2016-08-29 | Disposition: A | Discharge status: Home / Self Care | Payer: Medicare HMO | Source: Ambulatory Visit | Attending: Pulmonary Disease | Admitting: Pulmonary Disease

## 2016-08-29 ENCOUNTER — Encounter (HOSPITAL_COMMUNITY)
Admission: RE | Disposition: A | Discharge status: Home / Self Care | Payer: Self-pay | Source: Ambulatory Visit | Attending: Pulmonary Disease

## 2016-08-29 ENCOUNTER — Ambulatory Visit (HOSPITAL_COMMUNITY): Payer: Medicare HMO

## 2016-08-29 DIAGNOSIS — Z7951 Long term (current) use of inhaled steroids: Secondary | ICD-10-CM | POA: Insufficient documentation

## 2016-08-29 DIAGNOSIS — I10 Essential (primary) hypertension: Secondary | ICD-10-CM | POA: Diagnosis not present

## 2016-08-29 DIAGNOSIS — J449 Chronic obstructive pulmonary disease, unspecified: Secondary | ICD-10-CM | POA: Diagnosis not present

## 2016-08-29 DIAGNOSIS — Z9889 Other specified postprocedural states: Secondary | ICD-10-CM

## 2016-08-29 DIAGNOSIS — F17210 Nicotine dependence, cigarettes, uncomplicated: Secondary | ICD-10-CM | POA: Diagnosis not present

## 2016-08-29 DIAGNOSIS — Z79899 Other long term (current) drug therapy: Secondary | ICD-10-CM | POA: Diagnosis not present

## 2016-08-29 DIAGNOSIS — R918 Other nonspecific abnormal finding of lung field: Secondary | ICD-10-CM | POA: Insufficient documentation

## 2016-08-29 DIAGNOSIS — A159 Respiratory tuberculosis unspecified: Secondary | ICD-10-CM | POA: Diagnosis not present

## 2016-08-29 DIAGNOSIS — J984 Other disorders of lung: Secondary | ICD-10-CM | POA: Diagnosis not present

## 2016-08-29 DIAGNOSIS — E559 Vitamin D deficiency, unspecified: Secondary | ICD-10-CM | POA: Diagnosis not present

## 2016-08-29 DIAGNOSIS — I739 Peripheral vascular disease, unspecified: Secondary | ICD-10-CM | POA: Insufficient documentation

## 2016-08-29 DIAGNOSIS — J209 Acute bronchitis, unspecified: Secondary | ICD-10-CM | POA: Diagnosis not present

## 2016-08-29 HISTORY — PX: VIDEO BRONCHOSCOPY: SHX5072

## 2016-08-29 LAB — BODY FLUID CELL COUNT WITH DIFFERENTIAL
LYMPHS FL: 1 %
Lymphs, Fluid: 1 %
MONOCYTE-MACROPHAGE-SEROUS FLUID: 12 % — AB (ref 50–90)
MONOCYTE-MACROPHAGE-SEROUS FLUID: 5 % — AB (ref 50–90)
NEUTROPHIL FLUID: 90 % — AB (ref 0–25)
Neutrophil Count, Fluid: 94 % — ABNORMAL HIGH (ref 0–25)
Total Nucleated Cell Count, Fluid: 680 cu mm (ref 0–1000)
WBC FLUID: 11325 uL — AB (ref 0–1000)

## 2016-08-29 LAB — RESPIRATORY CULTURE OR RESPIRATORY AND SPUTUM CULTURE
GRAM STAIN: NONE SEEN
ORGANISM ID, BACTERIA: NORMAL

## 2016-08-29 LAB — PNEUMOCYSTIS JIROVECI SMEAR BY DFA: PNEUMOCYSTIS JIROVECI AG: NEGATIVE

## 2016-08-29 SURGERY — BRONCHOSCOPY, WITH FLUOROSCOPY
Anesthesia: Moderate Sedation | Laterality: Bilateral

## 2016-08-29 MED ORDER — PHENYLEPHRINE HCL 0.25 % NA SOLN: 1.0000 | Freq: Four times a day (QID) | NASAL | Status: DC | PRN

## 2016-08-29 MED ORDER — PHENYLEPHRINE HCL 0.25 % NA SOLN
NASAL | Status: DC | PRN
  Administered 2016-08-29: 2 via NASAL

## 2016-08-29 MED ORDER — FENTANYL CITRATE (PF) 100 MCG/2ML IJ SOLN
INTRAMUSCULAR | Status: DC | PRN
  Administered 2016-08-29 (×4): 25 ug via INTRAVENOUS

## 2016-08-29 MED ORDER — LIDOCAINE HCL 2 % EX GEL: 1.0000 "application " | Freq: Once | CUTANEOUS | Status: DC

## 2016-08-29 MED ORDER — MIDAZOLAM HCL 10 MG/2ML IJ SOLN
INTRAMUSCULAR | Status: DC | PRN
  Administered 2016-08-29 (×3): 1 mg via INTRAVENOUS

## 2016-08-29 MED ORDER — MIDAZOLAM HCL 5 MG/ML IJ SOLN
INTRAMUSCULAR | Status: DC | PRN
  Administered 2016-08-29: 1 mg via INTRAVENOUS

## 2016-08-29 MED ORDER — LIDOCAINE HCL 2 % EX GEL
CUTANEOUS | Status: DC | PRN
  Administered 2016-08-29: 1

## 2016-08-29 MED ORDER — LIDOCAINE HCL 1 % IJ SOLN
INTRAMUSCULAR | Status: DC | PRN
Start: 2016-08-29 — End: 2016-08-29
  Administered 2016-08-29: 6 mL via RESPIRATORY_TRACT

## 2016-08-29 MED ORDER — FENTANYL CITRATE (PF) 100 MCG/2ML IJ SOLN
INTRAMUSCULAR | Status: AC
  Filled 2016-08-29: qty 4

## 2016-08-29 MED ORDER — MIDAZOLAM HCL 5 MG/ML IJ SOLN
INTRAMUSCULAR | Status: AC
  Filled 2016-08-29: qty 2

## 2016-08-29 MED ORDER — BUTAMBEN-TETRACAINE-BENZOCAINE 2-2-14 % EX AERO: 1.0000 | INHALATION_SPRAY | Freq: Once | CUTANEOUS | Status: DC

## 2016-08-29 MED ORDER — SODIUM CHLORIDE 0.9 % IV SOLN
INTRAVENOUS | Status: DC
  Administered 2016-08-29: 10:00:00 via INTRAVENOUS

## 2016-08-29 NOTE — Progress Notes (Signed)
Video Bronchoscopy done Intervention Bronchial washing done Procedure tolerated well 

## 2016-08-29 NOTE — Op Note (Signed)
[facility] Cardiopulmonary Patient Name: Lawrence Sanford Procedure Date: 08/29/2016 MRN: [id number] Attending MD: Marshell Garfinkel , MD Date of Birth: 10-27-1950 CSN: [id number] Age: 66 Admit Type: Outpatient Ethnicity: Not Hispanic or Latino Procedure:            Bronchoscopy Indications:          Right upper lobe cavitary lesion Providers:            Marshell Garfinkel, MD, Andre Lefort RRT,RCP, Ashley Mariner RRT,RCP Referring MD:          Medicines:            Midazolam 3 mg mg IV, Fentanyl 75 mcg IV Complications:        No immediate complications Estimated Blood Loss: Estimated blood loss: none. Procedure:      Pre-Anesthesia Assessment:      - A History and Physical has been performed. Patient meds and allergies       have been reviewed. The risks and benefits of the procedure and the       sedation options and risks were discussed with the patient. All       questions were answered and informed consent was obtained. Patient       identification and proposed procedure were verified prior to the       procedure by the physician in the procedure room. Mental Status       Examination: alert and oriented. Airway Examination: normal       oropharyngeal airway. CV Examination: normal. ASA Grade Assessment: I -       A normal healthy patient. After reviewing the risks and benefits, the       patient was deemed in satisfactory condition to undergo the procedure.       The anesthesia plan was to use moderate sedation / analgesia (conscious       sedation). Immediately prior to administration of medications, the       patient was re-assessed for adequacy to receive sedatives. The heart       rate, respiratory rate, oxygen saturations, blood pressure, adequacy of       pulmonary ventilation, and response to care were monitored throughout       the procedure. The physical status of the patient was re-assessed after       the procedure.      After  obtaining informed consent, the bronchoscope was passed under       direct vision. Throughout the procedure, the patient's blood pressure,       pulse, and oxygen saturations were monitored continuously. the [id number]       [id number] scope was introduced through the right nostril and advanced to       the tracheobronchial tree. The procedure was accomplished with ease. The       patient tolerated the procedure well. The total duration of the       procedure was 15 minutes. Findings:      The nasopharynx/oropharynx appears normal. The larynx appears normal.       The vocal cords appear normal. The subglottic space is normal. The       trachea is of normal caliber. The carina is sharp. The tracheobronchial       tree was examined to at least the first subsegmental level. Bronchial  mucosa and anatomy are normal; there are no endobronchial lesions. Thick       purulent seceretions were noted in right lung.      Bronchoalveolar lavage was performed in the RUL apical segment (B1) and       in the RML medial segment (B5) of the lung. 180 mL of fluid were       instilled. 90 mL were returned. The return was mucopurulent. Mucous       plugs were present in the return fluid. Biopsies were planned but not       done as the appearence on bronch was more consistent with infection Impression:      - Right upper lobe cavitary lesion      - The examination showed thick purulent seceretions.      - Bronchoalveolar lavage was performed. Moderate Sedation:      Moderate (conscious) sedation was administered by the endoscopy nurse       and supervised by the endoscopist. The following parameters were       monitored: oxygen saturation, heart rate, blood pressure, respiratory       rate, EKG, adequacy of pulmonary ventilation, and response to care.       Total physician intraservice time was 15 minutes. Recommendation:      - Await BAL results. Procedure Code(s):      --- Professional ---      [phone],  Bronchoscopy, rigid or flexible, including fluoroscopic guidance,       when performed; with bronchial alveolar lavage      99152, Moderate sedation services provided by the same physician or       other qualified health care professional performing the diagnostic or       therapeutic service that the sedation supports, requiring the presence       of an independent trained observer to assist in the monitoring of the       patient's level of consciousness and physiological status; initial 15       minutes of intraservice time, patient age 41 years or older Diagnosis Code(s):      --- Professional ---      R91.8, Other nonspecific abnormal finding of lung field CPT copyright 2016 American Medical Association. All rights reserved. The codes documented in this report are preliminary and upon coder review may  be revised to meet current compliance requirements. Marshell Garfinkel, MD 08/29/2016 11:01:46 AM Number of Addenda: 0 Scope In: 10:20:29 AM Scope Out: 10:33:57 AM

## 2016-08-29 NOTE — Discharge Instructions (Signed)
Flexible Bronchoscopy, Care After These instructions give you information on caring for yourself after your procedure. Your doctor may also give you more specific instructions. Call your doctor if you have any problems or questions after your procedure. Follow these instructions at home:  Do not eat or drink anything for 2 hours after your procedure. If you try to eat or drink before the medicine wears off, food or drink could go into your lungs. You could also burn yourself.  After 2 hours have passed and when you can cough and gag normally, you may eat soft food and drink liquids slowly.  The day after the test, you may eat your normal diet.  You may do your normal activities.  Keep all doctor visits. Get help right away if:  You get more and more short of breath.  You get light-headed.  You feel like you are going to pass out (faint).  You have chest pain.  You have new problems that worry you.  You cough up more than a little blood.  You cough up more blood than before. This information is not intended to replace advice given to you by your health care provider. Make sure you discuss any questions you have with your health care provider. Document Released: 02/24/2009 Document Revised: 10/05/2015 Document Reviewed: 01/01/2013 Elsevier Interactive Patient Education  2017 [place] not eat or drink until after 12:30 pm today 08/29/16

## 2016-08-30 ENCOUNTER — Telehealth: Payer: Self-pay

## 2016-08-30 ENCOUNTER — Telehealth: Payer: Self-pay | Admitting: Pulmonary Disease

## 2016-08-30 ENCOUNTER — Ambulatory Visit: Payer: Medicare HMO | Admitting: Pulmonary Disease

## 2016-08-30 ENCOUNTER — Encounter (HOSPITAL_COMMUNITY): Payer: Self-pay | Admitting: Pulmonary Disease

## 2016-08-30 LAB — ACID FAST SMEAR (AFB, MYCOBACTERIA)
Acid Fast Smear: POSITIVE — AB
Acid Fast Smear: POSITIVE — AB

## 2016-08-30 LAB — ACID FAST SMEAR (AFB)

## 2016-08-30 NOTE — Telephone Encounter (Signed)
Spoke with pt,aware of recs.  Nothing further needed.  

## 2016-08-30 NOTE — Telephone Encounter (Signed)
Ok to take OTC ibuprufen and mucinex

## 2016-08-30 NOTE — Telephone Encounter (Signed)
Pt aware of positive AFB. I advised pt to stay home until furthered instructed by the health department.  I will contact the health department on 09/02/16 to make them aware, as they are currently closed.  Pt voiced his understanding and had no further questions. Nothing further needed.

## 2016-08-30 NOTE — Telephone Encounter (Signed)
Called by lab notifying me of the postive AFB. Agree with recommendations by CMA earlier today.

## 2016-08-30 NOTE — Telephone Encounter (Signed)
Pt c/o R sided chest pain after BAL yesterday.  Pt denies fever, hemoptysis, difficulty breathing.    Pt has not done anything to help with pain- no ibuprofen, heating pad, etc.  Pt is requesting recs from provider as he does not wish to take anything without MD recs.  PM please advise on recs.  Thanks.

## 2016-09-01 LAB — CULTURE, BAL-QUANTITATIVE
CULTURE: NORMAL — AB
SPECIAL REQUESTS: NORMAL
SPECIAL REQUESTS: NORMAL

## 2016-09-01 LAB — CULTURE, BAL-QUANTITATIVE W GRAM STAIN
Culture: 60000 — AB
Gram Stain: NONE SEEN

## 2016-09-02 NOTE — Telephone Encounter (Signed)
I have spoken with Tammy with TB control at [place] , and made her aware of positive AFB. I have faxed labs, last OV and CXR results to provided number. Received successful fax confirmation.  Nothing further needed at this time.

## 2016-09-03 ENCOUNTER — Telehealth: Payer: Self-pay | Admitting: Pulmonary Disease

## 2016-09-03 NOTE — Telephone Encounter (Signed)
Disreguard, this was made in error, needing to speak to lab.Lawrence Sanford

## 2016-09-06 ENCOUNTER — Emergency Department (HOSPITAL_COMMUNITY): Payer: Medicare HMO

## 2016-09-06 ENCOUNTER — Inpatient Hospital Stay (HOSPITAL_COMMUNITY)
Admission: EM | Admit: 2016-09-06 | Discharge: 2016-09-12 | DRG: 682 | Disposition: A | Discharge status: Home / Self Care | Payer: Medicare HMO | Attending: Family Medicine | Admitting: Family Medicine

## 2016-09-06 ENCOUNTER — Encounter (HOSPITAL_COMMUNITY): Payer: Self-pay

## 2016-09-06 ENCOUNTER — Telehealth: Payer: Self-pay | Admitting: Acute Care

## 2016-09-06 ENCOUNTER — Telehealth: Payer: Self-pay | Admitting: Pulmonary Disease

## 2016-09-06 ENCOUNTER — Inpatient Hospital Stay (HOSPITAL_COMMUNITY): Payer: Medicare HMO

## 2016-09-06 DIAGNOSIS — T502X5A Adverse effect of carbonic-anhydrase inhibitors, benzothiadiazides and other diuretics, initial encounter: Secondary | ICD-10-CM | POA: Diagnosis present

## 2016-09-06 DIAGNOSIS — L2082 Flexural eczema: Secondary | ICD-10-CM | POA: Diagnosis present

## 2016-09-06 DIAGNOSIS — R5381 Other malaise: Secondary | ICD-10-CM | POA: Diagnosis not present

## 2016-09-06 DIAGNOSIS — J9600 Acute respiratory failure, unspecified whether with hypoxia or hypercapnia: Secondary | ICD-10-CM | POA: Diagnosis not present

## 2016-09-06 DIAGNOSIS — Z681 Body mass index (BMI) 19 or less, adult: Secondary | ICD-10-CM | POA: Diagnosis not present

## 2016-09-06 DIAGNOSIS — R531 Weakness: Secondary | ICD-10-CM | POA: Diagnosis not present

## 2016-09-06 DIAGNOSIS — Z113 Encounter for screening for infections with a predominantly sexual mode of transmission: Secondary | ICD-10-CM

## 2016-09-06 DIAGNOSIS — D638 Anemia in other chronic diseases classified elsewhere: Secondary | ICD-10-CM | POA: Diagnosis present

## 2016-09-06 DIAGNOSIS — D509 Iron deficiency anemia, unspecified: Secondary | ICD-10-CM | POA: Diagnosis present

## 2016-09-06 DIAGNOSIS — R0602 Shortness of breath: Secondary | ICD-10-CM

## 2016-09-06 DIAGNOSIS — J440 Chronic obstructive pulmonary disease with acute lower respiratory infection: Secondary | ICD-10-CM | POA: Diagnosis present

## 2016-09-06 DIAGNOSIS — R404 Transient alteration of awareness: Secondary | ICD-10-CM | POA: Diagnosis not present

## 2016-09-06 DIAGNOSIS — Z8249 Family history of ischemic heart disease and other diseases of the circulatory system: Secondary | ICD-10-CM

## 2016-09-06 DIAGNOSIS — R197 Diarrhea, unspecified: Secondary | ICD-10-CM | POA: Diagnosis not present

## 2016-09-06 DIAGNOSIS — B370 Candidal stomatitis: Secondary | ICD-10-CM | POA: Diagnosis not present

## 2016-09-06 DIAGNOSIS — R079 Chest pain, unspecified: Secondary | ICD-10-CM | POA: Diagnosis not present

## 2016-09-06 DIAGNOSIS — E860 Dehydration: Secondary | ICD-10-CM | POA: Diagnosis not present

## 2016-09-06 DIAGNOSIS — D893 Immune reconstitution syndrome: Secondary | ICD-10-CM | POA: Diagnosis not present

## 2016-09-06 DIAGNOSIS — Z7951 Long term (current) use of inhaled steroids: Secondary | ICD-10-CM

## 2016-09-06 DIAGNOSIS — R509 Fever, unspecified: Secondary | ICD-10-CM | POA: Diagnosis not present

## 2016-09-06 DIAGNOSIS — A150 Tuberculosis of lung: Secondary | ICD-10-CM | POA: Diagnosis not present

## 2016-09-06 DIAGNOSIS — Z823 Family history of stroke: Secondary | ICD-10-CM | POA: Diagnosis not present

## 2016-09-06 DIAGNOSIS — D473 Essential (hemorrhagic) thrombocythemia: Secondary | ICD-10-CM | POA: Diagnosis present

## 2016-09-06 DIAGNOSIS — Z811 Family history of alcohol abuse and dependence: Secondary | ICD-10-CM | POA: Diagnosis not present

## 2016-09-06 DIAGNOSIS — D72828 Other elevated white blood cell count: Secondary | ICD-10-CM

## 2016-09-06 DIAGNOSIS — I739 Peripheral vascular disease, unspecified: Secondary | ICD-10-CM | POA: Diagnosis present

## 2016-09-06 DIAGNOSIS — I10 Essential (primary) hypertension: Secondary | ICD-10-CM | POA: Diagnosis not present

## 2016-09-06 DIAGNOSIS — N179 Acute kidney failure, unspecified: Secondary | ICD-10-CM | POA: Diagnosis not present

## 2016-09-06 DIAGNOSIS — Z833 Family history of diabetes mellitus: Secondary | ICD-10-CM | POA: Diagnosis not present

## 2016-09-06 DIAGNOSIS — D72829 Elevated white blood cell count, unspecified: Secondary | ICD-10-CM

## 2016-09-06 DIAGNOSIS — E876 Hypokalemia: Secondary | ICD-10-CM | POA: Diagnosis present

## 2016-09-06 DIAGNOSIS — Z79899 Other long term (current) drug therapy: Secondary | ICD-10-CM | POA: Diagnosis not present

## 2016-09-06 DIAGNOSIS — I469 Cardiac arrest, cause unspecified: Secondary | ICD-10-CM | POA: Diagnosis not present

## 2016-09-06 DIAGNOSIS — J159 Unspecified bacterial pneumonia: Secondary | ICD-10-CM

## 2016-09-06 DIAGNOSIS — J449 Chronic obstructive pulmonary disease, unspecified: Secondary | ICD-10-CM | POA: Diagnosis not present

## 2016-09-06 DIAGNOSIS — Z87891 Personal history of nicotine dependence: Secondary | ICD-10-CM

## 2016-09-06 LAB — URINALYSIS, ROUTINE W REFLEX MICROSCOPIC
BACTERIA UA: NONE SEEN
Bilirubin Urine: NEGATIVE
GLUCOSE, UA: 50 mg/dL — AB
Ketones, ur: 5 mg/dL — AB
LEUKOCYTES UA: NEGATIVE
NITRITE: NEGATIVE
PROTEIN: 30 mg/dL — AB
SPECIFIC GRAVITY, URINE: 1.014 (ref 1.005–1.030)
pH: 5 (ref 5.0–8.0)

## 2016-09-06 LAB — COMPREHENSIVE METABOLIC PANEL
ALK PHOS: 102 U/L (ref 38–126)
ALT: 22 U/L (ref 17–63)
AST: 31 U/L (ref 15–41)
Albumin: 3 g/dL — ABNORMAL LOW (ref 3.5–5.0)
Anion gap: 15 (ref 5–15)
BUN: 65 mg/dL — AB (ref 6–20)
CALCIUM: 10.8 mg/dL — AB (ref 8.9–10.3)
CO2: 22 mmol/L (ref 22–32)
CREATININE: 1.68 mg/dL — AB (ref 0.61–1.24)
Chloride: 98 mmol/L — ABNORMAL LOW (ref 101–111)
GFR, EST AFRICAN AMERICAN: 48 mL/min — AB (ref >60)
GFR, EST NON AFRICAN AMERICAN: 41 mL/min — AB (ref >60)
Glucose, Bld: 133 mg/dL — ABNORMAL HIGH (ref 65–99)
Potassium: 3.8 mmol/L (ref 3.5–5.1)
Sodium: 135 mmol/L (ref 135–145)
Total Bilirubin: 1 mg/dL (ref 0.3–1.2)
Total Protein: 8.5 g/dL — ABNORMAL HIGH (ref 6.5–8.1)

## 2016-09-06 LAB — CBC WITH DIFFERENTIAL/PLATELET
Basophils Absolute: 0 10*3/uL (ref 0.0–0.1)
Basophils Relative: 0 %
EOS ABS: 0 10*3/uL (ref 0.0–0.7)
EOS PCT: 0 %
HCT: 26.5 % — ABNORMAL LOW (ref 39.0–52.0)
HEMOGLOBIN: 8.5 g/dL — AB (ref 13.0–17.0)
Lymphocytes Relative: 6 %
Lymphs Abs: 1.1 10*3/uL (ref 0.7–4.0)
MCH: 25.8 pg — ABNORMAL LOW (ref 26.0–34.0)
MCHC: 32.1 g/dL (ref 30.0–36.0)
MCV: 80.3 fL (ref 78.0–100.0)
MONOS PCT: 15 %
Monocytes Absolute: 2.7 10*3/uL — ABNORMAL HIGH (ref 0.1–1.0)
NEUTROS PCT: 79 %
Neutro Abs: 13.9 10*3/uL — ABNORMAL HIGH (ref 1.7–7.7)
Platelets: 711 10*3/uL — ABNORMAL HIGH (ref 150–400)
RBC: 3.3 MIL/uL — ABNORMAL LOW (ref 4.22–5.81)
RDW: 14.9 % (ref 11.5–15.5)
WBC: 17.7 10*3/uL — AB (ref 4.0–10.5)

## 2016-09-06 LAB — I-STAT CG4 LACTIC ACID, ED: Lactic Acid, Venous: 1.63 mmol/L (ref 0.5–1.9)

## 2016-09-06 LAB — TSH: TSH: 2.87 u[IU]/mL (ref 0.350–4.500)

## 2016-09-06 MED ORDER — SODIUM CHLORIDE 0.9 % IV SOLN
INTRAVENOUS | Status: DC
  Administered 2016-09-06 – 2016-09-07 (×2): via INTRAVENOUS

## 2016-09-06 MED ORDER — SODIUM CHLORIDE 0.9 % IV BOLUS (SEPSIS)
2000.0000 mL | Freq: Once | INTRAVENOUS | Status: AC
  Administered 2016-09-06: 2000 mL via INTRAVENOUS

## 2016-09-06 MED ORDER — LEVOFLOXACIN IN D5W 750 MG/150ML IV SOLN
750.0000 mg | INTRAVENOUS | Status: DC
  Administered 2016-09-06: 750 mg via INTRAVENOUS
  Filled 2016-09-06: qty 150

## 2016-09-06 MED ORDER — ACETAMINOPHEN 325 MG PO TABS
650.0000 mg | ORAL_TABLET | Freq: Four times a day (QID) | ORAL | Status: DC | PRN
  Administered 2016-09-07 – 2016-09-08 (×2): 650 mg via ORAL
  Filled 2016-09-06 (×2): qty 2

## 2016-09-06 MED ORDER — SODIUM CHLORIDE 0.9% FLUSH
3.0000 mL | Freq: Two times a day (BID) | INTRAVENOUS | Status: DC
  Administered 2016-09-06 – 2016-09-11 (×7): 3 mL via INTRAVENOUS

## 2016-09-06 MED ORDER — ONDANSETRON HCL 4 MG/2ML IJ SOLN: 4.0000 mg | Freq: Four times a day (QID) | INTRAMUSCULAR | Status: DC | PRN

## 2016-09-06 MED ORDER — CARVEDILOL 6.25 MG PO TABS
6.2500 mg | ORAL_TABLET | Freq: Every day | ORAL | Status: DC
  Administered 2016-09-07 – 2016-09-12 (×6): 6.25 mg via ORAL
  Filled 2016-09-06 (×6): qty 1

## 2016-09-06 MED ORDER — ACETAMINOPHEN 650 MG RE SUPP: 650.0000 mg | Freq: Four times a day (QID) | RECTAL | Status: DC | PRN

## 2016-09-06 MED ORDER — ONDANSETRON HCL 4 MG PO TABS
4.0000 mg | ORAL_TABLET | Freq: Four times a day (QID) | ORAL | Status: DC | PRN
  Administered 2016-09-06 – 2016-09-07 (×2): 4 mg via ORAL
  Filled 2016-09-06 (×2): qty 1

## 2016-09-06 MED ORDER — GUAIFENESIN ER 600 MG PO TB12
600.0000 mg | ORAL_TABLET | Freq: Every day | ORAL | Status: DC
  Administered 2016-09-07 – 2016-09-12 (×6): 600 mg via ORAL
  Filled 2016-09-06 (×6): qty 1

## 2016-09-06 MED ORDER — MOMETASONE FURO-FORMOTEROL FUM 100-5 MCG/ACT IN AERO
2.0000 | INHALATION_SPRAY | Freq: Two times a day (BID) | RESPIRATORY_TRACT | Status: DC
  Administered 2016-09-06 – 2016-09-12 (×12): 2 via RESPIRATORY_TRACT
  Filled 2016-09-06: qty 8.8

## 2016-09-06 NOTE — ED Provider Notes (Signed)
[facility] DEPT Provider Note   CSN: [id number] Arrival date & time: 08/12/2016  1244     History   Chief Complaint Chief Complaint  Patient presents with  . generalized weakness    HPI Lawrence Sanford is a 66 y.o. male.  66 year old male diagnosed recently with TB who is currently on medication for this who presents with increased weakness times a week as well as anorexia. Has a prior history of TB in the past and had been on medications at the time. I reviewed the notes from the pulmonologist office and patient did indeed have a positive AFB tests. Patient denies any fevers, vomiting, diarrhea. Has been dyspneic but this is unchanged. Patient given contact with the pulmonologist office as well as the health department and was instructed to stay home. Family called EMS because of the concern for the patient's worsening diffuse weakness.      Past Medical History:  Diagnosis Date  . COPD (chronic obstructive pulmonary disease) ([place])   . Flexural eczema   . Hypertension   . PAD (peripheral artery disease) ([place])   . Vitamin D deficiency     Patient Active Problem List   Diagnosis Date Noted  . Cavitary lesion of lung 08/26/2016  . Tobacco abuse 01/02/2012  . Eczema 01/02/2012  . Chronic bronchitis with productive mucopurulent cough ([place]) 01/02/2012  . Hypertension 01/02/2012  . Left ventricular hypertrophy 01/02/2012  . Unexplained weight loss 01/02/2012    Past Surgical History:  Procedure Laterality Date  . VIDEO BRONCHOSCOPY Bilateral 08/29/2016   Procedure: VIDEO BRONCHOSCOPY WITH FLUORO;  Surgeon: Marshell Garfinkel, MD;  Location: [facility] ENDOSCOPY;  Service: Cardiopulmonary;  Laterality: Bilateral;       Home Medications    Prior to Admission medications   Medication Sig Start Date End Date Taking? Authorizing Provider  budesonide-formoterol (SYMBICORT) 80-4.5 MCG/ACT inhaler Inhale 2 puffs into the lungs 2 (two) times daily. 07/11/16  Yes Praveen Mannam, MD    carvedilol (COREG) 6.25 MG tablet Take 6.25 mg by mouth daily. 08/10/16  Yes Historical Provider, MD  D3-50 50000 units capsule Take 50,000 capsules by mouth once a week. Tuesday 06/05/16  Yes Historical Provider, MD  losartan-hydrochlorothiazide (HYZAAR) 50-12.5 MG tablet Take 1 tablet by mouth daily.  07/04/16  Yes Historical Provider, MD  guaiFENesin (MUCINEX) 600 MG 12 hr tablet Take 600 mg by mouth daily.    Historical Provider, MD    Family History Family History  Problem Relation Age of Onset  . Diabetes Mother   . Hypertension Mother   . Diabetes Father   . Alcohol abuse Father   . Hypertension Father   . Diabetes Sister   . Hypertension Sister   . Stroke Brother   . Hypertension Brother     Social History Social History  Substance Use Topics  . Smoking status: Former Smoker    Packs/day: 0.50    Years: 40.00    Types: Cigarettes  . Smokeless tobacco: Never Used  . Alcohol use No     Allergies   Patient has no known allergies.   Review of Systems Review of Systems  All other systems reviewed and are negative.    Physical Exam Updated Vital Signs BP 126/81 (BP Location: Left Arm)   Pulse (!) 101   Temp 97.4 F (36.3 C) (Oral)   Resp 18   Ht 5\' 9"  (1.753 m)   Wt 57.2 kg   SpO2 95%   BMI 18.61 kg/m   Physical Exam  Constitutional:  He is oriented to person, place, and time. He appears cachectic.  Non-toxic appearance. No distress.  HENT:  Head: Normocephalic and atraumatic.  Eyes: Conjunctivae, EOM and lids are normal. Pupils are equal, round, and reactive to light.  Neck: Normal range of motion. Neck supple. No tracheal deviation present. No thyroid mass present.  Cardiovascular: Normal rate, regular rhythm and normal heart sounds.  Exam reveals no gallop.   No murmur heard. Pulmonary/Chest: Effort normal and breath sounds normal. No stridor. No respiratory distress. He has no decreased breath sounds. He has no wheezes. He has no rhonchi. He has no  rales.  Abdominal: Soft. Normal appearance and bowel sounds are normal. He exhibits no distension. There is no tenderness. There is no rebound and no CVA tenderness.  Musculoskeletal: Normal range of motion. He exhibits no edema or tenderness.  Neurological: He is alert and oriented to person, place, and time. He has normal strength. No cranial nerve deficit or sensory deficit. GCS eye subscore is 4. GCS verbal subscore is 5. GCS motor subscore is 6.  Skin: Skin is warm and dry. No abrasion and no rash noted.  Psychiatric: He has a normal mood and affect. His speech is normal and behavior is normal.  Nursing note and vitals reviewed.    ED Treatments / Results  Labs (all labs ordered are listed, but only abnormal results are displayed) Labs Reviewed  CULTURE, BLOOD (ROUTINE X 2)  CULTURE, BLOOD (ROUTINE X 2)  COMPREHENSIVE METABOLIC PANEL  CBC WITH DIFFERENTIAL/PLATELET  URINALYSIS, ROUTINE W REFLEX MICROSCOPIC  I-STAT CG4 LACTIC ACID, ED    EKG  EKG Interpretation None       Radiology No results found.  Procedures Procedures (including critical care time)  Medications Ordered in ED Medications  sodium chloride 0.9 % bolus 2,000 mL (not administered)     Initial Impression / Assessment and Plan / ED Course  I have reviewed the triage vital signs and the nursing notes.  Pertinent labs & imaging results that were available during my care of the patient were reviewed by me and considered in my medical decision making (see chart for details).     Patient given IV fluids here for dehydration. Has evidence of acute kidney injury on his laboratory studies. Also has leukocytosis. Has a negative lactate. Will be admitted to the hospitalist service  Final Clinical Impressions(s) / ED Diagnoses   Final diagnoses:  None    New Prescriptions New Prescriptions   No medications on file     Lacretia Leigh, MD 08/28/2016 1506

## 2016-09-06 NOTE — Telephone Encounter (Signed)
This patient has an ED visit today. Let's see if he is admitted and then we can see if he is appropriate for screening.Put him back on your tickle list!! Thanks so much.

## 2016-09-06 NOTE — ED Notes (Signed)
Bed: RN16 Expected date: 08/16/2016 Expected time: 12:44 PM Means of arrival: Ambulance Comments: 66 yo M weakness, orthostatic +

## 2016-09-06 NOTE — Telephone Encounter (Signed)
Called to inform wife and pt's of Dr. Gustavus Bryant recommendation. Spouse informed us that they were in route to hospital at that time. Will close this encounter.

## 2016-09-06 NOTE — ED Notes (Signed)
Pt given urinal and made aware of need for urine specimen 

## 2016-09-06 NOTE — Telephone Encounter (Signed)
The treatment for TB is always outpatient unless the pt can't swallow them effectively and takes time to work  Does not need to leave house unless breathing gets worse or can't keep food down  Let Health dept evaluate before any further input from this office

## 2016-09-06 NOTE — H&P (Addendum)
History and Physical    Lawrence Sanford RFF:[id number] DOB: 1951-03-30 DOA: 08/26/2016  Referring MD/NP/PA: Dr. Zenia Resides  PCP: Pcp Not In System   Outpatient Specialists: Pulmonary, Dr. Vaughan Browner  Patient coming from: home  Chief Complaint: poor po intake and weakness   HPI: Lawrence Sanford is a 66 y.o. male with medical history significant for TB (has a health dept person who comes in to administer medications), history of hypertension. He presented to ED with worsening weakness and fatigue as well as poor po intake for past few days ago. Patient reported feeling more tired. No reports of chest pain but he does have a cough intermittently productive of yellowish sputum. No fevers. No chills. No reports of weight loss.  ED Course: In ED, BP was 126/81, HR 87-101, RR 18-29, oxygen saturation 80% which has improved to 95% with [place] oxygen support. Blood work showed WBC count of 17.7, hemoglobin of 8.5, platelets 711, creatinine 1.68, calcium 10.8, lactic acid WNL. CXR showed bilateral opacities. Will start empiric levaquin for possible superimposed pneumonia.  Review of Systems:  Constitutional: Negative for fever, chills, diaphoresis, positive for activity change, appetite change and fatigue.  HENT: Negative for ear pain, nosebleeds, congestion, facial swelling, rhinorrhea, neck pain, neck stiffness and ear discharge.   Eyes: Negative for pain, discharge, redness, itching and visual disturbance.  Respiratory: Negative for choking, chest tightness, wheezing and stridor.   Cardiovascular: Negative for chest pain, palpitations and leg swelling.  Gastrointestinal: Negative for abdominal distention.  Genitourinary: Negative for dysuria, urgency, frequency, hematuria, flank pain, decreased urine volume, difficulty urinating and dyspareunia.  Musculoskeletal: Negative for back pain, joint swelling, arthralgias and gait problem.  Neurological: per HPI Hematological: Negative for adenopathy. Does not  bruise/bleed easily.  Psychiatric/Behavioral: Negative for hallucinations, behavioral problems, confusion, dysphoric mood, decreased concentration and agitation.   Past Medical History:  Diagnosis Date  . COPD (chronic obstructive pulmonary disease) ([place])   . Flexural eczema   . Hypertension   . PAD (peripheral artery disease) ([place])   . Vitamin D deficiency     Past Surgical History:  Procedure Laterality Date  . VIDEO BRONCHOSCOPY Bilateral 08/29/2016   Procedure: VIDEO BRONCHOSCOPY WITH FLUORO;  Surgeon: Marshell Garfinkel, MD;  Location: [facility];  Service: Cardiopulmonary;  Laterality: Bilateral;    Social history:  reports that he has quit smoking. His smoking use included Cigarettes. He has a 20.00 pack-year smoking history. He has never used smokeless tobacco. He reports that he does not drink alcohol or use drugs.  Ambulation: ambulates without assistance at baseline.  No Known Allergies  Family History  Problem Relation Age of Onset  . Diabetes Mother   . Hypertension Mother   . Diabetes Father   . Alcohol abuse Father   . Hypertension Father   . Diabetes Sister   . Hypertension Sister   . Stroke Brother   . Hypertension Brother     Prior to Admission medications   Medication Sig Start Date End Date Taking? Authorizing Provider  budesonide-formoterol (SYMBICORT) 80-4.5 MCG/ACT inhaler Inhale 2 puffs into the lungs 2 (two) times daily. 07/11/16  Yes Praveen Mannam, MD  carvedilol (COREG) 6.25 MG tablet Take 6.25 mg by mouth daily. 08/10/16  Yes Historical Provider, MD  D3-50 50000 units capsule Take 50,000 capsules by mouth once a week. Tuesday 06/05/16  Yes Historical Provider, MD  losartan-hydrochlorothiazide (HYZAAR) 50-12.5 MG tablet Take 1 tablet by mouth daily.  07/04/16  Yes Historical Provider, MD  guaiFENesin (MUCINEX) 600 MG 12 hr  tablet Take 600 mg by mouth daily.    Historical Provider, MD    Physical Exam: Vitals:   08/18/2016 1315 08/12/2016 1400 08/24/2016  1430 08/31/2016 1530  BP:  (!) 144/81 (!) 160/96 (!) 155/89  Pulse:  88 87 99  Resp:  (!) 24 (!) 24 (!) 29  Temp:      TempSrc:      SpO2:  95% (!) 80% 94%  Weight: 57.2 kg (126 lb)     Height: _0  (1.753 m)       Constitutional: NAD, calm, comfortable Vitals:   08/29/2016 1315 08/24/2016 1400 09/07/2016 1430 08/21/2016 1530  BP:  (!) 144/81 (!) 160/96 (!) 155/89  Pulse:  88 87 99  Resp:  (!) 24 (!) 24 (!) 29  Temp:      TempSrc:      SpO2:  95% (!) 80% 94%  Weight: 57.2 kg (126 lb)     Height: _1  (1.753 m)      Eyes: PERRL, lids and conjunctivae normal ENMT: Mucous membranes are dry, no tonsillar erythema  Neck: normal, supple, no masses, no thyromegaly Respiratory: diminished breath sounds, coarse bilaterally  Cardiovascular: Regular rate and rhythm, appreciate S1, S2 Abdomen: no tenderness, no masses palpated. No hepatosplenomegaly. Bowel sounds positive.  Musculoskeletal: no clubbing / cyanosis. No joint deformity upper and lower extremities. Good ROM, no contractures. Normal muscle tone.  Skin: no rashes, lesions, ulcers. No induration Neurologic: CN 2-12 grossly intact. Sensation intact, DTR normal. Strength 5/5 in all 4.  Psychiatric: Normal judgment and insight. Alert and oriented x 3. Normal mood.   Labs on Admission: I have personally reviewed following labs and imaging studies  CBC:  Recent Labs Lab 08/13/2016 1357  WBC 17.7*  NEUTROABS 13.9*  HGB 8.5*  HCT 26.5*  MCV 80.3  PLT 621*   Basic Metabolic Panel:  Recent Labs Lab 08/16/2016 1357  NA 135  K 3.8  CL 98*  CO2 22  GLUCOSE 133*  BUN 65*  CREATININE 1.68*  CALCIUM 10.8*   GFR: Estimated Creatinine Clearance: 35.5 mL/min (A) (by C-G formula based on SCr of 1.68 mg/dL (H)). Liver Function Tests:  Recent Labs Lab 08/11/2016 1357  AST 31  ALT 22  ALKPHOS 102  BILITOT 1.0  PROT 8.5*  ALBUMIN 3.0*   No results for input(s): LIPASE, AMYLASE in the last 168 hours. No results for input(s):  AMMONIA in the last 168 hours. Coagulation Profile: No results for input(s): INR, PROTIME in the last 168 hours. Cardiac Enzymes: No results for input(s): CKTOTAL, CKMB, CKMBINDEX, TROPONINI in the last 168 hours. BNP (last 3 results) No results for input(s): PROBNP in the last 8760 hours. HbA1C: No results for input(s): HGBA1C in the last 72 hours. CBG: No results for input(s): GLUCAP in the last 168 hours. Lipid Profile: No results for input(s): CHOL, HDL, LDLCALC, TRIG, CHOLHDL, LDLDIRECT in the last 72 hours. Thyroid Function Tests: No results for input(s): TSH, T4TOTAL, FREET4, T3FREE, THYROIDAB in the last 72 hours. Anemia Panel: No results for input(s): VITAMINB12, FOLATE, FERRITIN, TIBC, IRON, RETICCTPCT in the last 72 hours. Urine analysis: No results found for: COLORURINE, APPEARANCEUR, LABSPEC, [place], GLUCOSEU, HGBUR, BILIRUBINUR, KETONESUR, PROTEINUR, UROBILINOGEN, NITRITE, LEUKOCYTESUR Sepsis Labs: _2 (procalcitonin:4,lacticidven:4) ) Recent Results (from the past 240 hour(s))  Culture, bal-quantitative     Status: Abnormal   Collection Time: 08/29/16 10:44 AM  Result Value Ref Range Status   Specimen Description BRONCHIAL ALVEOLAR LAVAGE RIGHT UPPER LUNG  Final  Special Requests Normal  Final   Gram Stain   Final    ABUNDANT WBC PRESENT, PREDOMINANTLY PMN FEW GRAM POSITIVE COCCI IN PAIRS RARE GRAM POSITIVE RODS    Culture (A)  Final    60,000 COLONIES/mL Consistent with normal respiratory flora. Performed at [facility], [place] [address] [place], [place] [address]    Report Status 09/01/2016 FINAL  Final  Pneumocystis smear by DFA     Status: None   Collection Time: 08/29/16 10:44 AM  Result Value Ref Range Status   Specimen Source-PJSRC BRONCHIAL ALVEOLAR LAVAGE  Final    Comment: RT UPPER LOBE   Pneumocystis jiroveci Ag NEGATIVE  Final    Comment: Performed at [facility]  Acid Fast Smear (AFB)     Status: Abnormal    Collection Time: 08/29/16 10:44 AM  Result Value Ref Range Status   AFB Specimen Processing Concentration  Final   Acid Fast Smear Positive (A)  Final    Comment: CRITICAL RESULT CALLED TO, READ BACK BY AND VERIFIED WITH: DR Lake Bells AT 2103 08/30/16 BY N.THOMPSON (NOTE) 4+, more than 36 acid-fast bacilli per field at 400X magnification, fluorescent smear Reported/Faxed results to Wille Glaser. at [place] PM on 4.20.18  KP Faxed to [phone] Performed At: [facility] [place], [place] [id number] Lindon Romp MD PR:[id number]    Source (AFB) BRONCHIAL ALVEOLAR LAVAGE  Final    Comment: RT UPPER LOBE  Fungus Culture With Stain     Status: None (Preliminary result)   Collection Time: 08/29/16 10:44 AM  Result Value Ref Range Status   Fungus Stain Final report  Final    Comment: (NOTE) Performed At: [facility] [place], [place] [id number] Lindon Romp MD RN:[id number]    Fungus (Mycology) Culture PENDING  Incomplete   Fungal Source BRONCHIAL ALVEOLAR LAVAGE  Final    Comment: RT UPPER LOBE  Fungus Culture Result     Status: None   Collection Time: 08/29/16 10:44 AM  Result Value Ref Range Status   Result 1 Comment  Final    Comment: (NOTE) KOH/Calcofluor preparation:  no fungus observed. Performed At: [facility] [place], [place] [id number] Lindon Romp MD OM:[id number]   Culture, bal-quantitative     Status: Abnormal   Collection Time: 08/29/16 10:49 AM  Result Value Ref Range Status   Specimen Description BRONCHIAL ALVEOLAR LAVAGE RT MIDDLE LOBE  Final   Special Requests Normal  Final   Gram Stain NO WBC SEEN NO ORGANISMS SEEN   Final   Culture (A)  Final    10,000 COLONIES/mL Consistent with normal respiratory flora. Performed at [facility], [place] [address], [place], [place] [address]    Report Status 09/01/2016 FINAL  Final  Acid Fast Smear (AFB)     Status: Abnormal   Collection  Time: 08/29/16 10:49 AM  Result Value Ref Range Status   AFB Specimen Processing Concentration  Final   Acid Fast Smear Positive (A)  Final    Comment: CRITICAL RESULT CALLED TO, READ BACK BY AND VERIFIED WITH: DR Lake Bells AT 2103 08/30/16 BY N.THOMPSON (NOTE) 3+, 4-36 acid-fast bacilli per field at 400X magnification, fluorescent smear POSITIVE SMEAR REPORTED TO SHANNON P. AT 1125 ON 08/30/16 BY AM. Performed At: [facility] [address] [place], [place] [id number] Lindon Romp MD HW:[id number]    Source (AFB) BRONCHIAL ALVEOLAR LAVAGE  Final  Comment: RT MIDDLE LOBE  Fungus Culture With Stain     Status: None   Collection Time: 08/29/16 10:49 AM  Result Value Ref Range Status   Fungus Stain Final report  Final   Fungus (Mycology) Culture Preliminary report  Final    Comment: (NOTE) Performed At: [facility] [place], [place] [id number] Lindon Romp MD HO:[id number]    Fungal Source BRONCHIAL ALVEOLAR LAVAGE  Final    Comment: RT MIDDLE LOBE  Fungus Culture Result     Status: None   Collection Time: 08/29/16 10:49 AM  Result Value Ref Range Status   Result 1 Comment  Final    Comment: (NOTE) KOH/Calcofluor preparation:  no fungus observed. Performed At: [facility] [place], [place] [id number] Lindon Romp MD [id number]   Fungal organism reflex     Status: None   Collection Time: 08/29/16 10:49 AM  Result Value Ref Range Status   Fungal result 1 Candida albicans  Final    Comment: (NOTE) 1-2 colonies                                            . Performed At: [facility] [place], [place] [id number] Lindon Romp MD VW:[id number]      Radiological Exams on Admission: Dg Chest Port 1 View Result Date: 08/26/2016 Stable bilateral pulmonary opacities, right greater than left, compared to prior exam. Electronically Signed   By: Marijo Conception, M.D.   On: 09/05/2016 14:26      EKG: Not done in ED  Assessment/Plan  Active Problems: AKI (acute kidney injury) ([place]) / Dehydration  - Unclear etiology, possibly from TB meds verus Hyzaar (Hyzaar placed on hold) - Continue IV fluids - Follow up renal function in am  Generalized weakness - Likely in the setting of TB and possibly from meds used for treatment of TB - Obtain PT eval  Possible community acquired pneumonia / Leukocytosis  - Possible pneumonia superimposed on TB - Bilateral pulmonary opacities seen in CXR - Started empiric Levaquin while awaiting blood cx, strep pneumonia results   Cavitary right upper lung lobe lesion - S/P bronch 4/19 and BAL; Had positive AFB - Given TB meds (by health dept)  Thrombocytosis - Likely reactive, stress demargination   Essential hypertension - Continue carvedilol - Hold Hyzaar due to renal insufficiency   Hypercalcemia - Due to dehydration - Continue IV fluids - Follow up Calcium level in am   DVT prophylaxis: SCD's bilaterally  Code Status: full code Family Communication: no family at the bedside  Disposition Plan: medical floor  Consults called: PT Admission status: Inpatient, pt dehydrated clinically and with AKI on blood work. Requires IV fluids for hydration. Also requires Levaquin for possible pneumonia.   Leisa Lenz MD [facility] Pager [phone]  If 7PM-7AM, please contact night-coverage [email or web address] Password [facility]  09/05/2016, 3:49 PM

## 2016-09-06 NOTE — ED Triage Notes (Signed)
Per EMS- Patient's family reports that the patient was recently hospitalized and had a suspected CXR for TB. Patient called EMS because the patient has poor po intake and has generalized weakness. patient had + orthostatic VS. BP-110/80, HR-110 lying and BP-90/70, HR-125 standing.

## 2016-09-06 NOTE — ED Triage Notes (Signed)
Patient unsure if he has been taking TB medication,but states a woman from the health department comes everyday to give him medication.

## 2016-09-06 NOTE — Telephone Encounter (Signed)
Spoke with Lawrence Sanford's wife Hilda Blades and Lawrence Sanford, since starting taking TB meds Lawrence Sanford has been feeling unwell (Lawrence Sanford and wife unsure of what Lawrence Sanford is taking for TB- states the health dept "just gives him what he needs".   Lawrence Sanford c/o difficulty sleeping, body aches, fatigue, prod cough with clear/white mucus (no hemoptysis), difficulty eating/drinking.  Lawrence Sanford's wife has also called health dept and health dept nurse is coming to Lawrence Sanford's home for further evaluation.   Lawrence Sanford's wife states that she believes Lawrence Sanford "just needs to get out of the house and be seen at the hospital, I don't know how to manage this by myself".  Health Dept nurse requested Lawrence Sanford call our office for further recs.    Lawrence Sanford uses [facility] on [address].   Sending to DOD as PM is elink tonight.  MW please advise on further recs.  Thanks!   08/26/16 AVS:  Patient Instructions   It is nice to see you today. We will get a CXR today We will call with CXR results. We will collect sputum today. We will need to collect 3 sputum specimans. Mucinex 1200 mg once daily with a full glass of water ( OK to use generic) Delsym cough syrup for cough. 1 teaspoon every 12 hours. If we are unable to get sputum samples, we will need to schedule a procedure to allow [place] to collect sputum called a bronchoscopy. Try over the counter decongestant for your ear congestion. Please work on quitting smoking.This is very important. I have provided you with the " Be stronger than your excuses " card with community resources to assist with smoking cessation. Schedule bronch with Dr. Vaughan Browner at first available time. Follow up in 1 month with Dr. Vaughan Browner Please contact office for sooner follow up if symptoms do not improve or worsen or seek emergency care

## 2016-09-06 NOTE — Progress Notes (Signed)
Pharmacy Antibiotic Note  Lawrence Sanford is a 66 y.o. male admitted on 08/11/2016 with CAP.  Pharmacy has been consulted for Levofloxacin dosing.  Plan: Levofloxacin 750mg  IV q48h. Monitor renal function, cultures, clinical course.   Height: 5\' 9"  (175.3 cm) Weight: 126 lb (57.2 kg) IBW/kg (Calculated) : 70.7  Temp (24hrs), Avg:97.8 F (36.6 C), Min:97.4 F (36.3 C), Max:98.1 F (36.7 C)   Recent Labs Lab 08/12/2016 1351 09/01/2016 1357  WBC  --  17.7*  CREATININE  --  1.68*  LATICACIDVEN 1.63  --     Estimated Creatinine Clearance: 35.5 mL/min (A) (by C-G formula based on SCr of 1.68 mg/dL (H)).    No Known Allergies  Antimicrobials this admission: 4/27 >> Levofloxacin >>  Dose adjustments this admission: --  Microbiology results: 4/27 BCx: sent 4/27 UCx: sent  4/27 Strep pneumo ur ag: ordered  Thank you for allowing pharmacy to be a part of this patient's care.   Lindell Spar, PharmD, BCPS Pager: [phone] 08/20/2016 8:42 PM

## 2016-09-06 NOTE — Progress Notes (Signed)
Rec'd call from [place] with Health Dept, stating patient on way to ED, prev had Bronch @ [facility], 4 x +AFB, make sure ED receives him with isolation room & places mask on him - called [place] chg nurse @ [place] chg nurse @ [facility] to advise he is on his way, not sure which hospital he is going to - reminded them to have isolation room ready & place mask on him when he arrives, gave both nurses the patient's name & dob.

## 2016-09-07 DIAGNOSIS — D72829 Elevated white blood cell count, unspecified: Secondary | ICD-10-CM

## 2016-09-07 LAB — GLUCOSE, CAPILLARY: Glucose-Capillary: 105 mg/dL — ABNORMAL HIGH (ref 65–99)

## 2016-09-07 LAB — STREP PNEUMONIAE URINARY ANTIGEN: STREP PNEUMO URINARY ANTIGEN: NEGATIVE

## 2016-09-07 LAB — CBC
HCT: 24.2 % — ABNORMAL LOW (ref 39.0–52.0)
Hemoglobin: 7.7 g/dL — ABNORMAL LOW (ref 13.0–17.0)
MCH: 25.8 pg — ABNORMAL LOW (ref 26.0–34.0)
MCHC: 31.8 g/dL (ref 30.0–36.0)
MCV: 80.9 fL (ref 78.0–100.0)
PLATELETS: 606 10*3/uL — AB (ref 150–400)
RBC: 2.99 MIL/uL — ABNORMAL LOW (ref 4.22–5.81)
RDW: 15.2 % (ref 11.5–15.5)
WBC: 17.2 10*3/uL — ABNORMAL HIGH (ref 4.0–10.5)

## 2016-09-07 LAB — BASIC METABOLIC PANEL
Anion gap: 13 (ref 5–15)
BUN: 39 mg/dL — ABNORMAL HIGH (ref 6–20)
CALCIUM: 10.2 mg/dL (ref 8.9–10.3)
CO2: 20 mmol/L — ABNORMAL LOW (ref 22–32)
CREATININE: 1.11 mg/dL (ref 0.61–1.24)
Chloride: 106 mmol/L (ref 101–111)
GFR calc Af Amer: >60 mL/min (ref >60)
Glucose, Bld: 118 mg/dL — ABNORMAL HIGH (ref 65–99)
Potassium: 3.7 mmol/L (ref 3.5–5.1)
Sodium: 139 mmol/L (ref 135–145)

## 2016-09-07 MED ORDER — PYRAZINAMIDE 500 MG PO TABS
1000.0000 mg | ORAL_TABLET | Freq: Every day | ORAL | Status: DC
  Administered 2016-09-07 – 2016-09-09 (×3): 1000 mg via ORAL
  Filled 2016-09-07 (×3): qty 2

## 2016-09-07 MED ORDER — LEVOFLOXACIN IN D5W 750 MG/150ML IV SOLN: 750.0000 mg | INTRAVENOUS | Status: DC

## 2016-09-07 MED ORDER — DEXTROSE 5 % IV SOLN
1.0000 g | INTRAVENOUS | Status: DC
  Administered 2016-09-07 – 2016-09-11 (×5): 1 g via INTRAVENOUS
  Filled 2016-09-07 (×6): qty 10

## 2016-09-07 MED ORDER — ISONIAZID 300 MG PO TABS
300.0000 mg | ORAL_TABLET | Freq: Every day | ORAL | Status: DC
  Administered 2016-09-07 – 2016-09-09 (×3): 300 mg via ORAL
  Filled 2016-09-07 (×3): qty 1

## 2016-09-07 MED ORDER — ETHAMBUTOL HCL 400 MG PO TABS
800.0000 mg | ORAL_TABLET | Freq: Every day | ORAL | Status: DC
  Administered 2016-09-07 – 2016-09-09 (×3): 800 mg via ORAL
  Filled 2016-09-07 (×3): qty 2

## 2016-09-07 MED ORDER — RIFAMPIN 300 MG PO CAPS
600.0000 mg | ORAL_CAPSULE | Freq: Every day | ORAL | Status: DC
  Administered 2016-09-07 – 2016-09-09 (×3): 600 mg via ORAL
  Filled 2016-09-07 (×3): qty 2

## 2016-09-07 MED ORDER — VITAMIN B-6 50 MG PO TABS
50.0000 mg | ORAL_TABLET | Freq: Every day | ORAL | Status: DC
  Administered 2016-09-07 – 2016-09-09 (×3): 50 mg via ORAL
  Filled 2016-09-07 (×3): qty 1

## 2016-09-07 NOTE — Progress Notes (Addendum)
Pharmacy Antibiotic Note  Lawrence Sanford is a 66 y.o. male with recent diagnosis of active TB on DOT per [facility] Department; admitted on 09/05/2016 with CAP.  Pharmacy has been consulted for Levofloxacin dosing. Patient doesn't know TB regimen, and appears meds were not reordered on admission.  Today, 09/07/2016:  After discussion with TRH and ID, will switch Levaquin to Rocephin (to preserve potency for possible TB tx) and resume RIPE TB regimen   Plan:  D/c Levaquin  Rocephin 1g IV q24 hr  Per ID recommendations:  Isoniazid 5 mg/kgPO daily with Vit B6 50 mg daily  Rifampin 10 mg/kg PO daily  Pyrazinamide 1000 mg PO daily  Ethambutol 800 mg PO daily  Please contact [facility] Department on Monday 4/30 to clarify actual TB regimen and monitoring requirements  Height: 5\' 9"  (175.3 cm) Weight: 121 lb 0.5 oz (54.9 kg) IBW/kg (Calculated) : 70.7  Temp (24hrs), Avg:98.7 F (37.1 C), Min:98 F (36.7 C), Max:100 F (37.8 C)   Recent Labs Lab 08/16/2016 1351 09/01/2016 1357 09/07/16 0436  WBC  --  17.7* 17.2*  CREATININE  --  1.68* 1.11  LATICACIDVEN 1.63  --   --     Estimated Creatinine Clearance: 51.5 mL/min (by C-G formula based on SCr of 1.11 mg/dL).    No Known Allergies  Antimicrobials this admission: 4/27 >> Levofloxacin >> 4/28 4/28 >> Ceftriaxone >> 4/28 >> Isoniazid >> 4/28 >> Rifampin >> 4/28 >> Pyrazinamide >> 4/28 >> Ethambutol >>  Dose adjustments this admission: --  Microbiology results: 4/27 BCx: sent 4/27 UCx: sent  4/27 Strep pneumo ur ag: ordered  Thank you for allowing pharmacy to be a part of this patient's care.   Reuel Boom, PharmD, BCPS Pager: [phone] 09/07/2016, 3:39 PM

## 2016-09-07 NOTE — Progress Notes (Addendum)
Patient ID: Lawrence Sanford, male   DOB: [birth year], 66 y.o.   MRN: [id number]    PROGRESS NOTE    Lawrence Sanford  UTM:[id number] DOB: 16-Oct-1950 DOA: 09/07/2016  PCP: Pcp Not In System   Brief Narrative:    66 y.o. male with medical history significant for TB (has a health dept person who comes in to administer medications), history of hypertension. He presented to ED with worsening weakness and fatigue as well as poor po intake for past few days ago. Patient reported feeling more tired. No reports of chest pain but he does have a cough intermittently productive of yellowish sputum. No fevers. No chills. No reports of weight loss.  ED Course: In ED, BP was 126/81, HR 87-101, RR 18-29, oxygen saturation 80% which has improved to 95% with [place] oxygen support. Blood work showed WBC count of 17.7, hemoglobin of 8.5, platelets 711, creatinine 1.68, calcium 10.8, lactic acid WNL. CXR showed bilateral opacities. Will start empiric levaquin for possible superimposed pneumonia.  Assessment & Plan:   Active Problems: AKI (acute kidney injury) ([employer]) / Dehydration  - Unclear etiology, possibly from TB meds verus Hyzaar (Hyzaar placed on hold) - Continue IV fluids - Cr is trending down - BMP in AM  Generalized weakness - Likely in the setting of TB and possibly from meds used for treatment of TB - PT eval requested   Possible community acquired pneumonia / Leukocytosis  - Possible pneumonia superimposed on TB - Bilateral pulmonary opacities seen in CXR - Started empiric Levaquin which was changed to Rocephin today  Cavitary right upper lung lobe lesion - S/P bronch 4/19 and BAL; Had positive AFB - Given TB meds (by health dept) - continue here - ID team will see on Monday   Thrombocytosis - Likely reactive, stress demargination   Essential hypertension - Continue carvedilol - Hold Hyzaar due to renal insufficiency   Hypercalcemia - Due to dehydration - Continue IV fluids - Ca  is now WNL   Anemia suspected to be from chronic disease - anemia panel requested with next blood work - CBC in AM  DVT prophylaxis: Lovenox SQ Code Status: Full  Family Communication: Patient at bedside  Disposition Plan: To be determined   Consultants:   ID to see on Monday   Procedures:   None  Antimicrobials:   Levaquin 4/27 -->  4/28  changed to Rocephin 4/28 -->  Subjective: No events overnight.   Objective: Vitals:   09/04/2016 2049 09/07/16 0125 09/07/16 0438 09/07/16 1359  BP: 126/60  (!) 158/73 140/69  Pulse: (!) 102  (!) 110 95  Resp: _0 Temp: 98 F (36.7 C)  100 F (37.8 C) 97.5 F (36.4 C)  TempSrc: Oral  Oral Oral  SpO2: 97%  99% 91%  Weight:  54.9 kg (121 lb 0.5 oz)    Height:        Intake/Output Summary (Last 24 hours) at 09/07/16 1904 Last data filed at 09/07/16 1655  Gross per 24 hour  Intake              480 ml  Output             1600 ml  Net            -1120 ml   Filed Weights   09/08/2016 1315 08/11/2016 1613 09/07/16 0125  Weight: 57.2 kg (126 lb) 57.2 kg (126 lb) 54.9 kg (121 lb 0.5 oz)    Examination:  General exam: Appears calm and comfortable  Respiratory system: Rhonchi at bases  Cardiovascular system: S1 & S2 heard, RRR. No JVD, murmurs, rubs, gallops or clicks.  Gastrointestinal system: Abdomen is nondistended, soft and nontender. No organomegaly or masses felt. Normal bowel sounds heard. Central nervous system: Alert and oriented. No focal neurological deficits. Extremities: Symmetric 5 x 5 power.  Data Reviewed: I have personally reviewed following labs and imaging studies  CBC:  Recent Labs Lab 08/13/2016 1357 09/07/16 0436  WBC 17.7* 17.2*  NEUTROABS 13.9*  --   HGB 8.5* 7.7*  HCT 26.5* 24.2*  MCV 80.3 80.9  PLT 711* 884*   Basic Metabolic Panel:  Recent Labs Lab 08/18/2016 1357 09/07/16 0436  NA 135 139  K 3.8 3.7  CL 98* 106  CO2 22 20*  GLUCOSE 133* 118*  BUN 65* 39*  CREATININE 1.68* 1.11   CALCIUM 10.8* 10.2   Liver Function Tests:  Recent Labs Lab 08/15/2016 1357  AST 31  ALT 22  ALKPHOS 102  BILITOT 1.0  PROT 8.5*  ALBUMIN 3.0*   CBG:  Recent Labs Lab 09/07/16 0747  GLUCAP 105*   Thyroid Function Tests:  Recent Labs  08/12/2016 1643  TSH 2.870   Urine analysis:    Component Value Date/Time   COLORURINE YELLOW 08/16/2016 1459   APPEARANCEUR CLEAR 08/15/2016 1459   LABSPEC 1.014 08/31/2016 1459   PHURINE 5.0 08/17/2016 1459   GLUCOSEU 50 (A) 08/22/2016 1459   HGBUR SMALL (A) 08/18/2016 1459   BILIRUBINUR NEGATIVE 08/28/2016 1459   KETONESUR 5 (A) 08/31/2016 1459   PROTEINUR 30 (A) 09/01/2016 1459   NITRITE NEGATIVE 08/17/2016 1459   LEUKOCYTESUR NEGATIVE 08/22/2016 1459   Recent Results (from the past 240 hour(s))  Culture, bal-quantitative     Status: Abnormal   Collection Time: 08/29/16 10:44 AM  Result Value Ref Range Status   Specimen Description BRONCHIAL ALVEOLAR LAVAGE RIGHT UPPER LUNG  Final   Special Requests Normal  Final   Gram Stain   Final    ABUNDANT WBC PRESENT, PREDOMINANTLY PMN FEW GRAM POSITIVE COCCI IN PAIRS RARE GRAM POSITIVE RODS    Culture (A)  Final    60,000 COLONIES/mL Consistent with normal respiratory flora. Performed at [facility], [place] [address], [place], [place] [address]    Report Status 09/01/2016 FINAL  Final  Pneumocystis smear by DFA     Status: None   Collection Time: 08/29/16 10:44 AM  Result Value Ref Range Status   Specimen Source-PJSRC BRONCHIAL ALVEOLAR LAVAGE  Final    Comment: RT UPPER LOBE   Pneumocystis jiroveci Ag NEGATIVE  Final    Comment: Performed at [facility]  Acid Fast Smear (AFB)     Status: Abnormal   Collection Time: 08/29/16 10:44 AM  Result Value Ref Range Status   AFB Specimen Processing Concentration  Final   Acid Fast Smear Positive (A)  Final    Comment: CRITICAL RESULT CALLED TO, READ BACK BY AND VERIFIED WITH: DR Lake Bells AT 2103 08/30/16 BY  N.THOMPSON (NOTE) 4+, more than 36 acid-fast bacilli per field at 400X magnification, fluorescent smear Reported/Faxed results to Wille Glaser. at [place] PM on 4.20.18  KP Faxed to [phone] Performed At: [facility] [place], [place] [id number] Lindon Romp MD RK:[id number]    Source (AFB) BRONCHIAL ALVEOLAR LAVAGE  Final    Comment: RT UPPER LOBE  Fungus Culture With Stain     Status: None  Collection Time: 08/29/16 10:44 AM  Result Value Ref Range Status   Fungus Stain Final report  Final   Fungus (Mycology) Culture Preliminary report  Final    Comment: (NOTE) Performed At: [facility] [place], [place] [id number] Lindon Romp MD DD:[id number]    Fungal Source BRONCHIAL ALVEOLAR LAVAGE  Final    Comment: RT UPPER LOBE  Fungus Culture Result     Status: None   Collection Time: 08/29/16 10:44 AM  Result Value Ref Range Status   Result 1 Comment  Final    Comment: (NOTE) KOH/Calcofluor preparation:  no fungus observed. Performed At: [facility] [place], [place] [id number] Lindon Romp MD VV:[id number]   Fungal organism reflex     Status: None   Collection Time: 08/29/16 10:44 AM  Result Value Ref Range Status   Fungal result 1 Candida albicans  Final    Comment: (NOTE) 1-2 colonies                                            . Performed At: [facility] [place], [place] [id number] Lindon Romp MD VO:[id number]   Culture, bal-quantitative     Status: Abnormal   Collection Time: 08/29/16 10:49 AM  Result Value Ref Range Status   Specimen Description BRONCHIAL ALVEOLAR LAVAGE RT MIDDLE LOBE  Final   Special Requests Normal  Final   Gram Stain NO WBC SEEN NO ORGANISMS SEEN   Final   Culture (A)  Final    10,000 COLONIES/mL Consistent with normal respiratory flora. Performed at [facility], [place] [address], [place], [place] [address]    Report Status  09/01/2016 FINAL  Final  Acid Fast Smear (AFB)     Status: Abnormal   Collection Time: 08/29/16 10:49 AM  Result Value Ref Range Status   AFB Specimen Processing Concentration  Final   Acid Fast Smear Positive (A)  Final    Comment: CRITICAL RESULT CALLED TO, READ BACK BY AND VERIFIED WITH: DR Lake Bells AT 2103 08/30/16 BY N.THOMPSON (NOTE) 3+, 4-36 acid-fast bacilli per field at 400X magnification, fluorescent smear POSITIVE SMEAR REPORTED TO SHANNON P. AT 1125 ON 08/30/16 BY AM. Performed At: [facility] [place], [place] [id number] Lindon Romp MD OF:[id number]    Source (AFB) BRONCHIAL ALVEOLAR LAVAGE  Final    Comment: RT MIDDLE LOBE  Fungus Culture With Stain     Status: None   Collection Time: 08/29/16 10:49 AM  Result Value Ref Range Status   Fungus Stain Final report  Final   Fungus (Mycology) Culture Preliminary report  Final    Comment: (NOTE) Performed At: [facility] [place], [place] [id number] Lindon Romp MD RW:[id number]    Fungal Source BRONCHIAL ALVEOLAR LAVAGE  Final    Comment: RT MIDDLE LOBE  Fungus Culture Result     Status: None   Collection Time: 08/29/16 10:49 AM  Result Value Ref Range Status   Result 1 Comment  Final    Comment: (NOTE) KOH/Calcofluor preparation:  no fungus observed. Performed At: [facility] [place], [place] [id number] Lindon Romp MD ZT:[id number]   Fungal organism reflex     Status: None   Collection Time: 08/29/16 10:49 AM  Result Value Ref Range Status   Fungal result  1 Candida albicans  Final    Comment: (NOTE) 1-2 colonies                                            . Performed At: [facility] [place], [place] [id number] Lindon Romp MD PQ:[id number]   Blood Culture (routine x 2)     Status: None (Preliminary result)   Collection Time: 08/14/2016  1:47 PM  Result Value Ref Range Status   Specimen Description  BLOOD LEFT ANTECUBITAL  Final   Special Requests   Final    BOTTLES DRAWN AEROBIC AND ANAEROBIC Blood Culture adequate volume   Culture   Final    NO GROWTH < 24 HOURS Performed at [place] [address], [place], [place] [address]    Report Status PENDING  Incomplete  Blood Culture (routine x 2)     Status: None (Preliminary result)   Collection Time: 08/21/2016  1:57 PM  Result Value Ref Range Status   Specimen Description BLOOD RIGHT ANTECUBITAL  Final   Special Requests IN PEDIATRIC BOTTLE Blood Culture adequate volume  Final   Culture   Final    NO GROWTH < 24 HOURS Performed at [facility], [place] [address], [place], [place] [address]    Report Status PENDING  Incomplete      Radiology Studies: Dg Chest Port 1 View  Result Date: 09/02/2016 CLINICAL DATA:  Shortness of breath, chest pain. EXAM: PORTABLE CHEST 1 VIEW COMPARISON:  Radiograph of August 29, 2016. FINDINGS: The heart size and mediastinal contours are within normal limits. Stable left basilar scarring is noted. Stable diffuse reticulonodular densities noted in right lung consistent with inflammation. Stable cavitary abnormality seen in right upper lobe. No significant changes compared to prior exam. The visualized skeletal structures are unremarkable. IMPRESSION: Stable bilateral pulmonary opacities, right greater than left, compared to prior exam. Electronically Signed   By: Marijo Conception, M.D.   On: 08/24/2016 14:26   Scheduled Meds: . carvedilol  6.25 mg Oral Daily  . isoniazid  300 mg Oral QHS   And  . vitamin B-6  50 mg Oral QHS   And  . rifampin  600 mg Oral QHS   And  . pyrazinamide  1,000 mg Oral QHS   And  . ethambutol  800 mg Oral QHS  . guaiFENesin  600 mg Oral Daily  . mometasone-formoterol  2 puff Inhalation BID  . sodium chloride flush  3 mL Intravenous Q12H   Continuous Infusions: . sodium chloride 50 mL/hr at 09/07/16 1258  . cefTRIAXone (ROCEPHIN)  IV      LOS: 1 day    Time spent: 20 minutes   Faye Ramsay, MD [facility] Pager [phone]  If 7PM-7AM, please contact night-coverage [email or web address] Password [email or web address] 09/07/2016, 7:04 PM

## 2016-09-08 LAB — COMPREHENSIVE METABOLIC PANEL
ALT: 20 U/L (ref 17–63)
ANION GAP: 8 (ref 5–15)
AST: 34 U/L (ref 15–41)
Albumin: 2.3 g/dL — ABNORMAL LOW (ref 3.5–5.0)
Alkaline Phosphatase: 85 U/L (ref 38–126)
BILIRUBIN TOTAL: 1.1 mg/dL (ref 0.3–1.2)
BUN: 20 mg/dL (ref 6–20)
CHLORIDE: 108 mmol/L (ref 101–111)
CO2: 21 mmol/L — ABNORMAL LOW (ref 22–32)
Calcium: 9.8 mg/dL (ref 8.9–10.3)
Creatinine, Ser: 0.9 mg/dL (ref 0.61–1.24)
Glucose, Bld: 116 mg/dL — ABNORMAL HIGH (ref 65–99)
Potassium: 3.4 mmol/L — ABNORMAL LOW (ref 3.5–5.1)
Sodium: 137 mmol/L (ref 135–145)
TOTAL PROTEIN: 6.8 g/dL (ref 6.5–8.1)

## 2016-09-08 LAB — CBC
HEMATOCRIT: 21 % — AB (ref 39.0–52.0)
Hemoglobin: 6.6 g/dL — CL (ref 13.0–17.0)
MCH: 25.4 pg — ABNORMAL LOW (ref 26.0–34.0)
MCHC: 31.4 g/dL (ref 30.0–36.0)
MCV: 80.8 fL (ref 78.0–100.0)
PLATELETS: 492 10*3/uL — AB (ref 150–400)
RBC: 2.6 MIL/uL — ABNORMAL LOW (ref 4.22–5.81)
RDW: 15.4 % (ref 11.5–15.5)
WBC: 23.2 10*3/uL — ABNORMAL HIGH (ref 4.0–10.5)

## 2016-09-08 LAB — IRON AND TIBC
IRON: 14 ug/dL — AB (ref 45–182)
SATURATION RATIOS: 12 % — AB (ref 17.9–39.5)
TIBC: 120 ug/dL — ABNORMAL LOW (ref 250–450)
UIBC: 106 ug/dL

## 2016-09-08 LAB — RETICULOCYTES
RBC.: 2.6 MIL/uL — ABNORMAL LOW (ref 4.22–5.81)
RETIC CT PCT: 2.3 % (ref 0.4–3.1)
Retic Count, Absolute: 59.8 10*3/uL (ref 19.0–186.0)

## 2016-09-08 LAB — URINE CULTURE

## 2016-09-08 LAB — VITAMIN B12: VITAMIN B 12: 596 pg/mL (ref 180–914)

## 2016-09-08 LAB — PREPARE RBC (CROSSMATCH)

## 2016-09-08 LAB — FOLATE: FOLATE: 7.8 ng/mL (ref >5.9)

## 2016-09-08 LAB — GLUCOSE, CAPILLARY: GLUCOSE-CAPILLARY: 101 mg/dL — AB (ref 65–99)

## 2016-09-08 LAB — FERRITIN: Ferritin: 3694 ng/mL — ABNORMAL HIGH (ref 24–336)

## 2016-09-08 LAB — ABO/RH: ABO/RH(D): O POS

## 2016-09-08 MED ORDER — FERROUS SULFATE 325 (65 FE) MG PO TABS
325.0000 mg | ORAL_TABLET | Freq: Two times a day (BID) | ORAL | Status: DC
  Administered 2016-09-08 – 2016-09-12 (×9): 325 mg via ORAL
  Filled 2016-09-08 (×9): qty 1

## 2016-09-08 MED ORDER — SODIUM CHLORIDE 0.9 % IV SOLN: Freq: Once | INTRAVENOUS | Status: DC

## 2016-09-08 MED ORDER — POTASSIUM CHLORIDE CRYS ER 20 MEQ PO TBCR
40.0000 meq | EXTENDED_RELEASE_TABLET | Freq: Once | ORAL | Status: AC
  Administered 2016-09-08: 40 meq via ORAL
  Filled 2016-09-08: qty 2

## 2016-09-08 NOTE — Progress Notes (Signed)
Patient ID: Lawrence Sanford, male   DOB: [birth year], 66 y.o.   MRN: [id number]    PROGRESS NOTE    Lawrence Sanford  ELF:[id number] DOB: 03/15/1951 DOA: 09/03/2016  PCP: Pcp Not In System   Brief Narrative:    66 y.o. male with medical history significant for TB (has a health dept person who comes in to administer medications), history of hypertension. He presented to ED with worsening weakness and fatigue as well as poor po intake for past few days ago. Patient reported feeling more tired. No reports of chest pain but he does have a cough intermittently productive of yellowish sputum. No fevers. No chills. No reports of weight loss.  ED Course: In ED, BP was 126/81, HR 87-101, RR 18-29, oxygen saturation 80% which has improved to 95% with [place] oxygen support. Blood work showed WBC count of 17.7, hemoglobin of 8.5, platelets 711, creatinine 1.68, calcium 10.8, lactic acid WNL. CXR showed bilateral opacities. Will start empiric levaquin for possible superimposed pneumonia.  Assessment & Plan:   Active Problems: AKI (acute kidney injury) ([place]) / Dehydration  - Unclear etiology, possibly from TB meds verus Hyzaar (Hyzaar placed on hold) - Continue IV fluids - Cr is trending down and is WNL - BMP in AM  Generalized weakness - Likely in the setting of TB and possibly from meds used for treatment of TB - PT eval requested   Possible community acquired pneumonia / Leukocytosis  - Possible pneumonia superimposed on TB - Bilateral pulmonary opacities seen in CXR - Started empiric Levaquin which was changed to Rocephin 4/28, will continue same regimen  - WBC still up, will check CBC in AM  Cavitary right upper lung lobe lesion - S/P bronch 4/19 and BAL; Had positive AFB - Given TB meds (by health dept) - continue here - ID team will see on Monday   Thrombocytosis - Likely reactive, stress demargination  - improving overall   Essential hypertension - Continue carvedilol - Hold  Hyzaar due to renal insufficiency   Hypokalemia - supplement and repeat BMP in AM  Hypercalcemia - Due to dehydration - Continue IV fluids - Ca is now WNL   Anemia suspected to be from chronic disease, IDA - anemia panel with Iron level 14 - Hg is down since admission, will give two U PRBC today  - CBC in AM  DVT prophylaxis: Lovenox SQ Code Status: Full  Family Communication: Patient at bedside  Disposition Plan: To be determined   Consultants:   ID to see on Monday   Procedures:   None  Antimicrobials:   Levaquin 4/27 -->  4/28  changed to Rocephin 4/28 -->  Subjective: No events overnight.   Objective: Vitals:   09/07/16 2031 09/07/16 2036 09/08/16 0507 09/08/16 0837  BP:  126/62 (!) 113/59   Pulse:  97 (!) 104   Resp:  18 16   Temp:  97.5 F (36.4 C) 99.4 F (37.4 C)   TempSrc:  Oral Oral   SpO2: 97% 98% 96% 97%  Weight:      Height:        Intake/Output Summary (Last 24 hours) at 09/08/16 1115 Last data filed at 09/08/16 0508  Gross per 24 hour  Intake             1080 ml  Output              575 ml  Net  505 ml   Filed Weights   08/18/2016 1315 09/01/2016 1613 09/07/16 0125  Weight: 57.2 kg (126 lb) 57.2 kg (126 lb) 54.9 kg (121 lb 0.5 oz)    Examination:  General exam: Appears calm and comfortable  Respiratory system: Rhonchi at bases  Cardiovascular system: S1 & S2 heard, RRR. No JVD, murmurs, rubs, gallops or clicks.  Gastrointestinal system: Abdomen is nondistended, soft and nontender. No organomegaly or masses felt. Normal bowel sounds heard. Central nervous system: Alert and oriented. No focal neurological deficits. Extremities: Symmetric 5 x 5 power.  Data Reviewed: I have personally reviewed following labs and imaging studies  CBC:  Recent Labs Lab 08/20/2016 1357 09/07/16 0436 09/08/16 0434  WBC 17.7* 17.2* 23.2*  NEUTROABS 13.9*  --   --   HGB 8.5* 7.7* 6.6*  HCT 26.5* 24.2* 21.0*  MCV 80.3 80.9 80.8  PLT  711* 606* 947*   Basic Metabolic Panel:  Recent Labs Lab 08/13/2016 1357 09/07/16 0436 09/08/16 0434  NA 135 139 137  K 3.8 3.7 3.4*  CL 98* 106 108  CO2 22 20* 21*  GLUCOSE 133* 118* 116*  BUN 65* 39* 20  CREATININE 1.68* 1.11 0.90  CALCIUM 10.8* 10.2 9.8   Liver Function Tests:  Recent Labs Lab 08/11/2016 1357 09/08/16 0434  AST 31 34  ALT 22 20  ALKPHOS 102 85  BILITOT 1.0 1.1  PROT 8.5* 6.8  ALBUMIN 3.0* 2.3*   CBG:  Recent Labs Lab 09/07/16 0747 09/08/16 0738  GLUCAP 105* 101*   Thyroid Function Tests:  Recent Labs  08/21/2016 1643  TSH 2.870   Urine analysis:    Component Value Date/Time   COLORURINE YELLOW 08/26/2016 1459   APPEARANCEUR CLEAR 08/28/2016 1459   LABSPEC 1.014 08/21/2016 1459   PHURINE 5.0 09/08/2016 1459   GLUCOSEU 50 (A) 09/07/2016 1459   HGBUR SMALL (A) 08/24/2016 1459   BILIRUBINUR NEGATIVE 08/27/2016 1459   KETONESUR 5 (A) 08/11/2016 1459   PROTEINUR 30 (A) 08/20/2016 1459   NITRITE NEGATIVE 09/01/2016 1459   LEUKOCYTESUR NEGATIVE 08/21/2016 1459   Recent Results (from the past 240 hour(s))  Blood Culture (routine x 2)     Status: None (Preliminary result)   Collection Time: 09/07/2016  1:47 PM  Result Value Ref Range Status   Specimen Description BLOOD LEFT ANTECUBITAL  Final   Special Requests   Final    BOTTLES DRAWN AEROBIC AND ANAEROBIC Blood Culture adequate volume   Culture   Final    NO GROWTH < 24 HOURS Performed at [facility], [place] [address] [place], [place] [address]    Report Status PENDING  Incomplete  Blood Culture (routine x 2)     Status: None (Preliminary result)   Collection Time: 08/29/2016  1:57 PM  Result Value Ref Range Status   Specimen Description BLOOD RIGHT ANTECUBITAL  Final   Special Requests IN PEDIATRIC BOTTLE Blood Culture adequate volume  Final   Culture   Final    NO GROWTH < 24 HOURS Performed at [facility], [place] [address] [place], [place] [address]    Report Status  PENDING  Incomplete  Urine culture     Status: Abnormal   Collection Time: 08/24/2016  2:59 PM  Result Value Ref Range Status   Specimen Description URINE, RANDOM  Final   Special Requests NONE  Final   Culture (A)  Final    <10,000 COLONIES/mL INSIGNIFICANT GROWTH Performed at [facility], [address] [place], [place] [address]    Report Status 09/08/2016 FINAL  Final      Radiology Studies: Dg Chest Port 1 View  Result Date: 09/05/2016 CLINICAL DATA:  Shortness of breath, chest pain. EXAM: PORTABLE CHEST 1 VIEW COMPARISON:  Radiograph of August 29, 2016. FINDINGS: The heart size and mediastinal contours are within normal limits. Stable left basilar scarring is noted. Stable diffuse reticulonodular densities noted in right lung consistent with inflammation. Stable cavitary abnormality seen in right upper lobe. No significant changes compared to prior exam. The visualized skeletal structures are unremarkable. IMPRESSION: Stable bilateral pulmonary opacities, right greater than left, compared to prior exam. Electronically Signed   By: Marijo Conception, M.D.   On: 08/15/2016 14:26   Scheduled Meds: . carvedilol  6.25 mg Oral Daily  . isoniazid  300 mg Oral QHS   And  . vitamin B-6  50 mg Oral QHS   And  . rifampin  600 mg Oral QHS   And  . pyrazinamide  1,000 mg Oral QHS   And  . ethambutol  800 mg Oral QHS  . guaiFENesin  600 mg Oral Daily  . mometasone-formoterol  2 puff Inhalation BID  . sodium chloride flush  3 mL Intravenous Q12H   Continuous Infusions: . sodium chloride 50 mL/hr at 09/07/16 1258  . sodium chloride    . cefTRIAXone (ROCEPHIN)  IV Stopped (09/07/16 2210)    LOS: 2 days   Time spent: 20 minutes   Faye Ramsay, MD [facility] Pager [phone]  If 7PM-7AM, please contact night-coverage [email or web address] Password [email or web address] 09/08/2016, 11:15 AM

## 2016-09-08 NOTE — Progress Notes (Signed)
Lawrence Sanford, NT noted red tinged coloration in [name]'s stool this am.  Hospitalist, Mo[place] notified this morning  of the above assessment.

## 2016-09-08 NOTE — Progress Notes (Signed)
Dr Doyle Askew notified of b/p of 89/58 HR 90 at 15 blood vitals, pt asymptomatic. Will monitor for now.

## 2016-09-08 NOTE — Progress Notes (Signed)
CRITICAL VALUE ALERT  Critical value received:  Hgb 6.6  Date of notification:  09/08/2016  Time of notification:  0455  Critical value read back:yes  Nurse who received alert:Mathea Frieling Milana Obey  MD notified (1st page):  Hospitalists  Time of first page:  0500  MD notified (2nd page):  Time of second page:  Responding MD:  Hal Hope  Time MD responded:  [phone]

## 2016-09-09 DIAGNOSIS — Z87891 Personal history of nicotine dependence: Secondary | ICD-10-CM

## 2016-09-09 DIAGNOSIS — Z823 Family history of stroke: Secondary | ICD-10-CM

## 2016-09-09 DIAGNOSIS — Z833 Family history of diabetes mellitus: Secondary | ICD-10-CM

## 2016-09-09 DIAGNOSIS — Z811 Family history of alcohol abuse and dependence: Secondary | ICD-10-CM

## 2016-09-09 DIAGNOSIS — Z8249 Family history of ischemic heart disease and other diseases of the circulatory system: Secondary | ICD-10-CM

## 2016-09-09 LAB — CBC
HCT: 29.3 % — ABNORMAL LOW (ref 39.0–52.0)
Hemoglobin: 9.8 g/dL — ABNORMAL LOW (ref 13.0–17.0)
MCH: 27.5 pg (ref 26.0–34.0)
MCHC: 33.4 g/dL (ref 30.0–36.0)
MCV: 82.3 fL (ref 78.0–100.0)
PLATELETS: 394 10*3/uL (ref 150–400)
RBC: 3.56 MIL/uL — ABNORMAL LOW (ref 4.22–5.81)
RDW: 15.4 % (ref 11.5–15.5)
WBC: 23.2 10*3/uL — ABNORMAL HIGH (ref 4.0–10.5)

## 2016-09-09 LAB — BASIC METABOLIC PANEL
Anion gap: 9 (ref 5–15)
BUN: 15 mg/dL (ref 6–20)
CALCIUM: 9.8 mg/dL (ref 8.9–10.3)
CO2: 21 mmol/L — ABNORMAL LOW (ref 22–32)
CREATININE: 0.8 mg/dL (ref 0.61–1.24)
Chloride: 110 mmol/L (ref 101–111)
GFR calc Af Amer: >60 mL/min (ref >60)
Glucose, Bld: 104 mg/dL — ABNORMAL HIGH (ref 65–99)
Potassium: 4 mmol/L (ref 3.5–5.1)
Sodium: 140 mmol/L (ref 135–145)

## 2016-09-09 LAB — GLUCOSE, CAPILLARY: GLUCOSE-CAPILLARY: 102 mg/dL — AB (ref 65–99)

## 2016-09-09 NOTE — Progress Notes (Signed)
Patient ID: Lawrence Sanford, male   DOB: 04-26-1951, 66 y.o.   MRN: [id number]    PROGRESS NOTE  Rollyn Scialdone  GBT:[id number] DOB: May 07, 1951 DOA: 09/07/2016  PCP: Pcp Not In System   Brief Narrative:    66 y.o. male with medical history significant for TB (has a health dept person who comes in to administer medications), history of hypertension. He presented to ED with worsening weakness and fatigue as well as poor po intake for past few days ago. Patient reported feeling more tired. No reports of chest pain but he does have a cough intermittently productive of yellowish sputum. No fevers. No chills. No reports of weight loss.  ED Course: In ED, BP was 126/81, HR 87-101, RR 18-29, oxygen saturation 80% which has improved to 95% with [place] oxygen support. Blood work showed WBC count of 17.7, hemoglobin of 8.5, platelets 711, creatinine 1.68, calcium 10.8, lactic acid WNL. CXR showed bilateral opacities. Will start empiric levaquin for possible superimposed pneumonia.  Assessment & Plan:   Active Problems: AKI (acute kidney injury) ([place]) / Dehydration  - Unclear etiology, possibly from TB meds verus Hyzaar (Hyzaar placed on hold) - stop IVF to see how pt does  - Cr is trending down and is WNL - BMP in AM  Generalized weakness - Likely in the setting of TB and possibly from meds used for treatment of TB - PT eval requested   Possible community acquired pneumonia / Leukocytosis  - Possible pneumonia superimposed on TB - Bilateral pulmonary opacities seen in CXR, stable cavitary RUL mass  - Started empiric Levaquin which was changed to Rocephin 4/28, will continue same regimen  - WBC still up and pt with Tmax 101 in the past 24 hours - ID team to see today - ? health department asked for repeat bronch and I spoke with our PCCM on call, unless pt clinically deteriorating, it is not standard of care to repeat bronch   Cavitary right upper lung lobe lesion - S/P bronch 4/19 and BAL; Had  positive AFB - Given TB meds (by health dept) - continue here - per PCCM, unless pt deteriorates clinically, no need for repeat bronch  - ID team to see today   Thrombocytosis - Likely reactive, stress demargination  - improving overall and WNL this am  Essential hypertension - Continue carvedilol - Hold Hyzaar due to renal insufficiency   Hypokalemia - supplemented and WNL this AM   Hypercalcemia - Due to dehydration - resolved, stop IVF  Anemia suspected to be from chronic disease, IDA - anemia panel with Iron level 14 - transfused two U PRBC 4/29, Hg up from 6.6 --> 9.8 - CBC in AM  DVT prophylaxis: Lovenox SQ Code Status: Full  Family Communication: Patient at bedside  Disposition Plan: To be determined   Consultants:   ID   PCCM over the phone   Procedures:   None  Antimicrobials:   Levaquin 4/27 -->  4/28  changed to Rocephin 4/28 -->  Subjective: No events overnight.   Objective: Vitals:   09/08/16 2134 09/08/16 2228 09/09/16 0458 09/09/16 0627  BP:  128/76 138/72   Pulse:  94 97   Resp:  18 18   Temp:  98.6 F (37 C) (!) 101 F (38.3 C) 100.1 F (37.8 C)  TempSrc:  Oral Oral   SpO2: 98% 99% 97%   Weight:      Height:        Intake/Output Summary (Last 24 hours)  at 09/09/16 1124 Last data filed at 09/09/16 1008  Gross per 24 hour  Intake          3906.33 ml  Output                0 ml  Net          3906.33 ml   Filed Weights   08/11/2016 1315 09/01/2016 1613 09/07/16 0125  Weight: 57.2 kg (126 lb) 57.2 kg (126 lb) 54.9 kg (121 lb 0.5 oz)    Examination:  General exam: Appears calm and comfortable  Respiratory system: Rhonchi at bases  Cardiovascular system: S1 & S2 heard, RRR. No JVD, murmurs, rubs, gallops or clicks.  Gastrointestinal system: Abdomen is nondistended, soft and nontender. No organomegaly or masses felt. Normal bowel sounds heard. Central nervous system: Alert and oriented. No focal neurological  deficits. Extremities: Symmetric 5 x 5 power.  Data Reviewed: I have personally reviewed following labs and imaging studies  CBC:  Recent Labs Lab 09/02/2016 1357 09/07/16 0436 09/08/16 0434 09/09/16 0414  WBC 17.7* 17.2* 23.2* 23.2*  NEUTROABS 13.9*  --   --   --   HGB 8.5* 7.7* 6.6* 9.8*  HCT 26.5* 24.2* 21.0* 29.3*  MCV 80.3 80.9 80.8 82.3  PLT 711* 606* 492* 094   Basic Metabolic Panel:  Recent Labs Lab 09/09/2016 1357 09/07/16 0436 09/08/16 0434 09/09/16 0414  NA 135 139 137 140  K 3.8 3.7 3.4* 4.0  CL 98* 106 108 110  CO2 22 20* 21* 21*  GLUCOSE 133* 118* 116* 104*  BUN 65* 39* 20 15  CREATININE 1.68* 1.11 0.90 0.80  CALCIUM 10.8* 10.2 9.8 9.8   Liver Function Tests:  Recent Labs Lab 08/11/2016 1357 09/08/16 0434  AST 31 34  ALT 22 20  ALKPHOS 102 85  BILITOT 1.0 1.1  PROT 8.5* 6.8  ALBUMIN 3.0* 2.3*   CBG:  Recent Labs Lab 09/07/16 0747 09/08/16 0738 09/09/16 0740  GLUCAP 105* 101* 102*   Thyroid Function Tests:  Recent Labs  08/23/2016 1643  TSH 2.870   Urine analysis:    Component Value Date/Time   COLORURINE YELLOW 08/26/2016 1459   APPEARANCEUR CLEAR 08/15/2016 1459   LABSPEC 1.014 08/29/2016 1459   PHURINE 5.0 09/03/2016 1459   GLUCOSEU 50 (A) 08/28/2016 1459   HGBUR SMALL (A) 08/15/2016 1459   BILIRUBINUR NEGATIVE 08/28/2016 1459   KETONESUR 5 (A) 08/26/2016 1459   PROTEINUR 30 (A) 09/02/2016 1459   NITRITE NEGATIVE 09/07/2016 1459   LEUKOCYTESUR NEGATIVE 09/05/2016 1459   Recent Results (from the past 240 hour(s))  Blood Culture (routine x 2)     Status: None (Preliminary result)   Collection Time: 08/11/2016  1:47 PM  Result Value Ref Range Status   Specimen Description BLOOD LEFT ANTECUBITAL  Final   Special Requests   Final    BOTTLES DRAWN AEROBIC AND ANAEROBIC Blood Culture adequate volume   Culture   Final    NO GROWTH 2 DAYS Performed at [facility], [place] [address] [place], [place] [address]    Report  Status PENDING  Incomplete  Blood Culture (routine x 2)     Status: None (Preliminary result)   Collection Time: 08/21/2016  1:57 PM  Result Value Ref Range Status   Specimen Description BLOOD RIGHT ANTECUBITAL  Final   Special Requests IN PEDIATRIC BOTTLE Blood Culture adequate volume  Final   Culture   Final    NO GROWTH 2 DAYS Performed  at [facility], [place] [address] [place], [place] [address]    Report Status PENDING  Incomplete  Urine culture     Status: Abnormal   Collection Time: 08/18/2016  2:59 PM  Result Value Ref Range Status   Specimen Description URINE, RANDOM  Final   Special Requests NONE  Final   Culture (A)  Final    <10,000 COLONIES/mL INSIGNIFICANT GROWTH Performed at [facility], [place] [address], [place], [place] [address]    Report Status 09/08/2016 FINAL  Final      Radiology Studies: No results found. Scheduled Meds: . carvedilol  6.25 mg Oral Daily  . isoniazid  300 mg Oral QHS   And  . vitamin B-6  50 mg Oral QHS   And  . rifampin  600 mg Oral QHS   And  . pyrazinamide  1,000 mg Oral QHS   And  . ethambutol  800 mg Oral QHS  . ferrous sulfate  325 mg Oral BID WC  . guaiFENesin  600 mg Oral Daily  . mometasone-formoterol  2 puff Inhalation BID  . sodium chloride flush  3 mL Intravenous Q12H   Continuous Infusions: . sodium chloride 50 mL/hr at 09/07/16 1258  . sodium chloride    . sodium chloride    . cefTRIAXone (ROCEPHIN)  IV Stopped (09/08/16 2245)    LOS: 3 days   Time spent: 20 minutes   Faye Ramsay, MD [facility] Pager [phone]  If 7PM-7AM, please contact night-coverage [email or web address] Password [email or web address] 09/09/2016, 11:24 AM

## 2016-09-09 NOTE — Progress Notes (Addendum)
Health depart requesting a TB PCR, I texted Dr Doyle Askew. Also Pt has had 3 loose stools, they are always mixed with urine ,a specimen has not been sent. Appetite poor, pt is drinking fluids but not eating much. Brother in to visit today.  I was trying to order TB PCR but this order apparently does not exist in our system, not sure how else to order it.   Faye Ramsay, MD  [facility] Pager [phone]  If 7PM-7AM, please contact night-coverage [email or web address] Password [email or web address]

## 2016-09-09 NOTE — Consult Note (Addendum)
[place] for Infectious Disease       Reason for Consult: possible tuberculosis    Referring Physician: Dr. Doyle Askew  Active Problems:   Hypertension   AKI (acute kidney injury) ([place])   Dehydration   . carvedilol  6.25 mg Oral Daily  . isoniazid  300 mg Oral QHS   And  . vitamin B-6  50 mg Oral QHS   And  . rifampin  600 mg Oral QHS   And  . pyrazinamide  1,000 mg Oral QHS   And  . ethambutol  800 mg Oral QHS  . ferrous sulfate  325 mg Oral BID WC  . guaiFENesin  600 mg Oral Daily  . mometasone-formoterol  2 puff Inhalation BID  . sodium chloride flush  3 mL Intravenous Q12H    Recommendations: Continue current RIPE antibiotics; doses confirmed and appreciate pharmacy confirming these.   No indication at this time for repeat BAL as he just recently started treatment.  Continue ceftriaxone for possible pneunmonia HIV test  Assessment: Fever, fatigue, weakness; new Mycobacterial infection, pending identification.    Leukocytosis - new.  Has increased since admission but clinically patient not having any new symptoms. Possible superimposed pneumonia.    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    Antibiotics: RIPE Ceftriaxone initially started on levaquin  HPI: Lawrence Sanford is a 66 y.o. male with bilateral cavitary lesions and BAL significant for + AFB culture who started on empiric Tb treatment last week but came in with above symptoms.  Did not have any fever but has been febrile here.  WBC elevated.  No rash, no sob, no diarrhea, no abdominal discomfort.  Has been eating but since starting RIPE has not had as much energy.  Some cough that has been persistent, intermittent sputum production.  No night sweats, no weight loss.   CXR independently reviewed and bilateral opacities noted and appear the same or even slightly better than previous.   Previous record reviewed from pulmonary.  He did have  hemoptysis in the past and worked up after noting more progressive symptoms of SOB and cavitary lesions and underwent BAL on 08/29/16.    Review of Systems:  Constitutional: negative for anorexia and weight loss Integument/breast: negative for rash and pruritus Musculoskeletal: negative for myalgias and arthralgias All other systems reviewed and are negative    Past Medical History:  Diagnosis Date  . COPD (chronic obstructive pulmonary disease) ([place])   . Flexural eczema   . Hypertension   . PAD (peripheral artery disease) ([place])   . Vitamin D deficiency     Social History  Substance Use Topics  . Smoking status: Former Smoker    Packs/day: 0.50    Years: 40.00    Types: Cigarettes  . Smokeless tobacco: Never Used  . Alcohol use No    Family History  Problem Relation Age of Onset  . Diabetes Mother   . Hypertension Mother   . Diabetes Father   . Alcohol abuse Father   . Hypertension Father   . Diabetes Sister   . Hypertension Sister   . Stroke Brother   . Hypertension Brother     No Known Allergies  Physical Exam: Constitutional: in no apparent distress and non-toxic  Vitals:   09/09/16 0627 09/09/16 1230  BP:    Pulse:  92  Resp:    Temp: 100.1 F (37.8 C) 98 F (  36.7 C)   EYES: anicteric ENMT: no thrush Cardiovascular: Cor RRR Respiratory: CTA B; normal respiratory effort GI: Bowel sounds are normal, liver is not enlarged, spleen is not enlarged, soft, nt Musculoskeletal: no pedal edema noted Skin: negatives: no rash Hematologic: no cervical lad  Lab Results  Component Value Date   WBC 23.2 (H) 09/09/2016   HGB 9.8 (L) 09/09/2016   HCT 29.3 (L) 09/09/2016   MCV 82.3 09/09/2016   PLT 394 09/09/2016    Lab Results  Component Value Date   CREATININE 0.80 09/09/2016   BUN 15 09/09/2016   NA 140 09/09/2016   K 4.0 09/09/2016   CL 110 09/09/2016   CO2 21 (L) 09/09/2016    Lab Results  Component Value Date   ALT 20 09/08/2016   AST 34  09/08/2016   ALKPHOS 85 09/08/2016     Microbiology: Recent Results (from the past 240 hour(s))  Blood Culture (routine x 2)     Status: None (Preliminary result)   Collection Time: 09/05/2016  1:47 PM  Result Value Ref Range Status   Specimen Description BLOOD LEFT ANTECUBITAL  Final   Special Requests   Final    BOTTLES DRAWN AEROBIC AND ANAEROBIC Blood Culture adequate volume   Culture   Final    NO GROWTH 2 DAYS Performed at [facility], [place] [address], [place], [place] [address]    Report Status PENDING  Incomplete  Blood Culture (routine x 2)     Status: None (Preliminary result)   Collection Time: 08/24/2016  1:57 PM  Result Value Ref Range Status   Specimen Description BLOOD RIGHT ANTECUBITAL  Final   Special Requests IN PEDIATRIC BOTTLE Blood Culture adequate volume  Final   Culture   Final    NO GROWTH 2 DAYS Performed at [facility], [place] [address] [place], [place] [address]    Report Status PENDING  Incomplete  Urine culture     Status: Abnormal   Collection Time: 08/24/2016  2:59 PM  Result Value Ref Range Status   Specimen Description URINE, RANDOM  Final   Special Requests NONE  Final   Culture (A)  Final    <10,000 COLONIES/mL INSIGNIFICANT GROWTH Performed at [facility], [place] [address], [place], [place] [address]    Report Status 09/08/2016 FINAL  Final    Scharlene Gloss, [place] for Infectious Disease [facility] [email or web address] [phone] pager  [phone] cell 09/09/2016, 1:24 PM

## 2016-09-10 DIAGNOSIS — J159 Unspecified bacterial pneumonia: Secondary | ICD-10-CM

## 2016-09-10 DIAGNOSIS — D893 Immune reconstitution syndrome: Secondary | ICD-10-CM

## 2016-09-10 DIAGNOSIS — 419620001 Death: Secondary | SNOMED CT | POA: Diagnosis not present

## 2016-09-10 DIAGNOSIS — Z113 Encounter for screening for infections with a predominantly sexual mode of transmission: Secondary | ICD-10-CM

## 2016-09-10 DIAGNOSIS — A150 Tuberculosis of lung: Secondary | ICD-10-CM

## 2016-09-10 DIAGNOSIS — R509 Fever, unspecified: Secondary | ICD-10-CM

## 2016-09-10 DIAGNOSIS — R5381 Other malaise: Secondary | ICD-10-CM

## 2016-09-10 LAB — C DIFFICILE QUICK SCREEN W PCR REFLEX
C Diff antigen: NEGATIVE
C Diff interpretation: NOT DETECTED
C Diff toxin: NEGATIVE

## 2016-09-10 LAB — BASIC METABOLIC PANEL
ANION GAP: 11 (ref 5–15)
BUN: 10 mg/dL (ref 6–20)
CO2: 19 mmol/L — ABNORMAL LOW (ref 22–32)
Calcium: 9.8 mg/dL (ref 8.9–10.3)
Chloride: 107 mmol/L (ref 101–111)
Creatinine, Ser: 0.77 mg/dL (ref 0.61–1.24)
Glucose, Bld: 118 mg/dL — ABNORMAL HIGH (ref 65–99)
POTASSIUM: 3.5 mmol/L (ref 3.5–5.1)
SODIUM: 137 mmol/L (ref 135–145)

## 2016-09-10 LAB — CBC
HCT: 32 % — ABNORMAL LOW (ref 39.0–52.0)
Hemoglobin: 10.7 g/dL — ABNORMAL LOW (ref 13.0–17.0)
MCH: 27.8 pg (ref 26.0–34.0)
MCHC: 33.4 g/dL (ref 30.0–36.0)
MCV: 83.1 fL (ref 78.0–100.0)
PLATELETS: 328 10*3/uL (ref 150–400)
RBC: 3.85 MIL/uL — AB (ref 4.22–5.81)
RDW: 15.8 % — ABNORMAL HIGH (ref 11.5–15.5)
WBC: 30.2 10*3/uL — AB (ref 4.0–10.5)

## 2016-09-10 LAB — PROCALCITONIN: PROCALCITONIN: 0.4 ng/mL

## 2016-09-10 LAB — LACTIC ACID, PLASMA
Lactic Acid, Venous: 1.6 mmol/L (ref 0.5–1.9)
Lactic Acid, Venous: 2 mmol/L (ref 0.5–1.9)

## 2016-09-10 MED ORDER — ETHAMBUTOL HCL 400 MG PO TABS
800.0000 mg | ORAL_TABLET | Freq: Every day | ORAL | Status: DC
  Administered 2016-09-10 – 2016-09-12 (×3): 800 mg via ORAL
  Filled 2016-09-10 (×3): qty 2

## 2016-09-10 MED ORDER — ISONIAZID 300 MG PO TABS
300.0000 mg | ORAL_TABLET | Freq: Every day | ORAL | Status: DC
  Administered 2016-09-10 – 2016-09-12 (×3): 300 mg via ORAL
  Filled 2016-09-10 (×3): qty 1

## 2016-09-10 MED ORDER — PYRAZINAMIDE 500 MG PO TABS
1000.0000 mg | ORAL_TABLET | Freq: Every day | ORAL | Status: DC
  Administered 2016-09-10 – 2016-09-12 (×3): 1000 mg via ORAL
  Filled 2016-09-10 (×3): qty 2

## 2016-09-10 MED ORDER — VITAMIN B-6 50 MG PO TABS
50.0000 mg | ORAL_TABLET | Freq: Every day | ORAL | Status: DC
  Administered 2016-09-10 – 2016-09-12 (×3): 50 mg via ORAL
  Filled 2016-09-10 (×4): qty 1

## 2016-09-10 MED ORDER — ENSURE ENLIVE PO LIQD
237.0000 mL | Freq: Two times a day (BID) | ORAL | Status: DC
  Administered 2016-09-11 – 2016-09-12 (×4): 237 mL via ORAL

## 2016-09-10 MED ORDER — RIFAMPIN 300 MG PO CAPS
600.0000 mg | ORAL_CAPSULE | Freq: Every day | ORAL | Status: DC
  Administered 2016-09-10 – 2016-09-12 (×3): 600 mg via ORAL
  Filled 2016-09-10 (×3): qty 2

## 2016-09-10 NOTE — Evaluation (Signed)
Physical Therapy Evaluation Patient Details Name: Lawrence Sanford MRN: [id number] DOB: [birth year] Today's Date: 09/10/2016   History of Present Illness  66 y.o. 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 He presented to ED with worsening weakness and fatigue as well as poor po intake for past few days. Dx of dehydration, AKI, R upper lobe lesion.  Clinical Impression  Pt admitted with above diagnosis. Pt currently with functional limitations due to the deficits listed below (see PT Problem List). Pt ambulated 20' with min A for balance. Pt had several episodes of unsteadiness. Will try RW or cane next session. Distance limited by SOB.Unable to obtain ambulating SaO2 as pulse oximeter did not give a reading.  Instructed pt in pursed lip breathing and in BLE exercises to prevent deconditioning.  Pt will benefit from skilled PT to increase their independence and safety with mobility to allow discharge to the venue listed below.       Follow Up Recommendations Home health PT    Equipment Recommendations  Rolling walker with 5" wheels;Wheelchair (measurements PT) (RW vs WC depending on progress)    Recommendations for Other Services OT consult (OT for energy conservation)     Precautions / Restrictions Precautions Precautions: Fall Precaution Comments: pt denies h/o falls, unsteady gait during PT eval Restrictions Weight Bearing Restrictions: No      Mobility  Bed Mobility Overal bed mobility: Modified Independent             General bed mobility comments: with rail  Transfers Overall transfer level: Needs assistance Equipment used: None Transfers: Sit to/from Stand Sit to Stand: Min guard         General transfer comment: min/guard for safety  Ambulation/Gait Ambulation/Gait assistance: Min assist Ambulation Distance (Feet): 20 Feet Assistive device: 1 person hand  held assist Gait Pattern/deviations: Step-to pattern;Staggering left;Staggering right     General Gait Details: min A for balance, several episodes of unsteady gait but pt able to self correct, distance limited by SOB, unable to obtain walking SaO2 as pulse ox did not give a reading. 3/4 dyspnea with walking. Instructed pt in pursed lip breathing as he was taking shallow, rapid breaths.  Stairs            Wheelchair Mobility    Modified Rankin (Stroke Patients Only)       Balance Overall balance assessment: Needs assistance   Sitting balance-Leahy Scale: Good       Standing balance-Leahy Scale: Fair                               Pertinent Vitals/Pain Pain Assessment: No/denies pain    Home Living Family/patient expects to be discharged to:: Private residence Living Arrangements: Spouse/significant other Available Help at Discharge: Family   Home Access: Stairs to enter Entrance Stairs-Rails: Right Entrance Stairs-Number of Steps: 5 Home Layout: One level Home Equipment: None      Prior Function Level of Independence: Independent               Hand Dominance   Dominant Hand: Right    Extremity/Trunk Assessment   Upper Extremity Assessment Upper Extremity Assessment: Overall WFL for tasks assessed    Lower Extremity Assessment Lower Extremity Assessment: Overall WFL for tasks assessed    Cervical / Trunk Assessment Cervical / Trunk Assessment: Normal  Communication   Communication:  No difficulties  Cognition Arousal/Alertness: Awake/alert Behavior During Therapy: WFL for tasks assessed/performed Overall Cognitive Status: Within Functional Limits for tasks assessed                                        General Comments      Exercises General Exercises - Lower Extremity Ankle Circles/Pumps: AROM;Both;10 reps;Supine Short Arc Quad: AROM;Both;5 reps;Supine   Assessment/Plan    PT Assessment Patient needs  continued PT services  PT Problem List Decreased activity tolerance;Decreased balance;Decreased mobility;Cardiopulmonary status limiting activity       PT Treatment Interventions Gait training;DME instruction;Therapeutic activities;Therapeutic exercise;Patient/family education;Functional mobility training    PT Goals (Current goals can be found in the Care Plan section)  Acute Rehab PT Goals Patient Stated Goal: to walk farther without SOB PT Goal Formulation: With patient Time For Goal Achievement: 09/24/16 Potential to Achieve Goals: Good    Frequency Min 3X/week   Barriers to discharge        Co-evaluation               AM-PAC PT "6 Clicks" Daily Activity  Outcome Measure Difficulty turning over in bed (including adjusting bedclothes, sheets and blankets)?: None Difficulty moving from lying on back to sitting on the side of the bed? : None Difficulty sitting down on and standing up from a chair with arms (e.g., wheelchair, bedside commode, etc,.)?: None Help needed moving to and from a bed to chair (including a wheelchair)?: A Little Help needed walking in hospital room?: A Little Help needed climbing 3-5 steps with a railing? : A Lot 6 Click Score: 20    End of Session Equipment Utilized During Treatment: Gait belt Activity Tolerance: Patient limited by fatigue;Other (comment) (SOB with activity) Patient left: in chair;with call bell/phone within reach;with chair alarm set Nurse Communication: Mobility status PT Visit Diagnosis: Unsteadiness on feet (R26.81);Difficulty in walking, not elsewhere classified (R26.2)    Time: 7121-9758 PT Time Calculation (min) (ACUTE ONLY): 21 min   Charges:   PT Evaluation $PT Eval Low Complexity: 1 Procedure     PT G Codes:          Philomena Doheny 09/10/2016, 11:51 AM [phone]

## 2016-09-10 NOTE — Progress Notes (Signed)
Subjective: No new complaints, he feels better   Antibiotics:  Anti-infectives    Start     Dose/Rate Route Frequency Ordered Stop   09/10/16 1700  isoniazid (NYDRAZID) tablet 300 mg     300 mg Oral Daily with supper 09/10/16 0924     09/10/16 1700  rifampin (RIFADIN) capsule 600 mg     600 mg Oral Daily with supper 09/10/16 0924     09/10/16 1700  pyrazinamide tablet 1,000 mg     1,000 mg Oral Daily with supper 09/10/16 0924     09/10/16 1700  ethambutol (MYAMBUTOL) tablet 800 mg     800 mg Oral Daily with supper 09/10/16 0924     09/07/16 2200  levofloxacin (LEVAQUIN) IVPB 750 mg  Status:  Discontinued     750 mg 100 mL/hr over 90 Minutes Intravenous Every 24 hours 09/07/16 1340 09/07/16 1527   09/07/16 2200  cefTRIAXone (ROCEPHIN) 1 g in dextrose 5 % 50 mL IVPB     1 g 100 mL/hr over 30 Minutes Intravenous Every 24 hours 09/07/16 1527     09/07/16 2200  isoniazid (NYDRAZID) tablet 300 mg  Status:  Discontinued     300 mg Oral Daily at bedtime 09/07/16 1527 09/10/16 0924   09/07/16 2200  rifampin (RIFADIN) capsule 600 mg  Status:  Discontinued     600 mg Oral Daily at bedtime 09/07/16 1527 09/10/16 0924   09/07/16 2200  pyrazinamide tablet 1,000 mg  Status:  Discontinued     1,000 mg Oral Daily at bedtime 09/07/16 1527 09/10/16 0924   09/07/16 2200  ethambutol (MYAMBUTOL) tablet 800 mg  Status:  Discontinued     800 mg Oral Daily at bedtime 09/07/16 1527 09/10/16 0924   09/03/2016 2200  levofloxacin (LEVAQUIN) IVPB 750 mg  Status:  Discontinued     750 mg 100 mL/hr over 90 Minutes Intravenous Every 48 hours 08/25/2016 2040 09/07/16 1340      Medications: Scheduled Meds: . carvedilol  6.25 mg Oral Daily  . isoniazid  300 mg Oral Q supper   And  . vitamin B-6  50 mg Oral Q supper   And  . rifampin  600 mg Oral Q supper   And  . pyrazinamide  1,000 mg Oral Q supper   And  . ethambutol  800 mg Oral Q supper  . ferrous sulfate  325 mg Oral BID WC  . guaiFENesin   600 mg Oral Daily  . mometasone-formoterol  2 puff Inhalation BID  . sodium chloride flush  3 mL Intravenous Q12H   Continuous Infusions: . sodium chloride    . sodium chloride    . cefTRIAXone (ROCEPHIN)  IV Stopped (09/09/16 2330)   PRN Meds:.acetaminophen **OR** acetaminophen, ondansetron **OR** ondansetron (ZOFRAN) IV    Objective: Weight change:   Intake/Output Summary (Last 24 hours) at 09/10/16 1017 Last data filed at 09/10/16 0619  Gross per 24 hour  Intake             1920 ml  Output              400 ml  Net             1520 ml   Blood pressure (!) 156/92, pulse (!) 102, temperature 99.3 F (37.4 C), temperature source Oral, resp. rate (!) 24, height _0  (1.753 m), weight 121 lb 0.5 oz (54.9 kg), SpO2 95 %. Temp:  [98 F (36.7  C)-99.8 F (37.7 C)] 99.3 F (37.4 C) (05/01 0618) Pulse Rate:  [92-102] 102 (04/30 2028) Resp:  [18-24] 24 (05/01 0618) BP: (124-156)/(73-92) 156/92 (05/01 0618) SpO2:  [95 %-98 %] 95 % (05/01 0836)  Physical Exam: General: Alert and awake, oriented x3, not in any acute distress. HEENT: anicteric sclera, pupils reactive to light and accommodation, EOMI CVS regular rate, normal r,  no murmur rubs or gallops Chest: diminished breath sounds at the bases Abdomen: soft nontender, nondistended, normal bowel sounds, Extremities: Skin: no rashes Neuro: nonfocal  CBC:  CBC    Component Value Date/Time   WBC 30.2 (H) 09/10/2016 0350   RBC 3.85 (L) 09/10/2016 0350   HGB 10.7 (L) 09/10/2016 0350   HCT 32.0 (L) 09/10/2016 0350   PLT 328 09/10/2016 0350   MCV 83.1 09/10/2016 0350   MCH 27.8 09/10/2016 0350   MCHC 33.4 09/10/2016 0350   RDW 15.8 (H) 09/10/2016 0350   LYMPHSABS 1.1 08/14/2016 1357   MONOABS 2.7 (H) 08/18/2016 1357   EOSABS 0.0 09/04/2016 1357   BASOSABS 0.0 09/01/2016 1357      BMET  Recent Labs  09/09/16 0414 09/10/16 0350  NA 140 137  K 4.0 3.5  CL 110 107  CO2 21* 19*  GLUCOSE 104* 118*  BUN 15 10    CREATININE 0.80 0.77  CALCIUM 9.8 9.8     Liver Panel   Recent Labs  09/08/16 0434  PROT 6.8  ALBUMIN 2.3*  AST 34  ALT 20  ALKPHOS 85  BILITOT 1.1       Sedimentation Rate No results for input(s): ESRSEDRATE in the last 72 hours. C-Reactive Protein No results for input(s): CRP in the last 72 hours.  Micro Results: Recent Results (from the past 720 hour(s))  Respiratory or Resp and Sputum Culture     Status: None   Collection Time: 08/26/16 11:37 AM  Result Value Ref Range Status   Gram Stain Abundant  Final   Gram Stain WBC present-predominately PMN  Final   Gram Stain No Squamous Epithelial Cells Seen  Final   Gram Stain Rare Gram Negative Rods  Final   Gram Stain Rare Gram Positive Cocci In Pairs  Final   Organism ID, Bacteria Normal Oropharyngeal Flora  Final  Fungus Culture & Smear     Status: None (Preliminary result)   Collection Time: 08/26/16 11:37 AM  Result Value Ref Range Status   Source: SPUTUM  Preliminary   Fungus (Mycology) Culture    Preliminary    Comment:   CULTURE, FUNGUS W/SMEAR NOT HAIR, SKIN, BLOOD       MICRO NUMBER:      [id number]   TEST STATUS:       PRELIMINARY   SPECIMEN SOURCE:   SPUTUM   SPECIMEN QUALITY:  ADEQUATE   SMEAR:             No fungal elements seen.   RESULT:            Scant growth of Yeast Isolated.                      Please contact the laboratory within 3 days if                      further identification is desired.   AFB Culture & Smear     Status: Abnormal (Preliminary result)   Collection Time: 08/26/16 11:37 AM  Result Value Ref Range  Status   Source: SPUTUM  Preliminary   Acid Fast Bacilli (AFB) Culture   (A)  Preliminary    Comment:   MYCOBACTERIA, CULTURE, WITH FLUOROCHROME SMEAR       MICRO NUMBER:      [id number]   TEST STATUS:       PRELIMINARY   SPECIMEN SOURCE:   SPUTUM   SPECIMEN QUALITY:  ADEQUATE   SMEAR:             Many (4 +) acid-fast bacilli seen using the                       fluorochrome method.   RESULT:            Acid-fast bacilli present in liquid culture media.                      Identification to follow.   Culture, bal-quantitative     Status: Abnormal   Collection Time: 08/29/16 10:44 AM  Result Value Ref Range Status   Specimen Description BRONCHIAL ALVEOLAR LAVAGE RIGHT UPPER LUNG  Final   Special Requests Normal  Final   Gram Stain   Final    ABUNDANT WBC PRESENT, PREDOMINANTLY PMN FEW GRAM POSITIVE COCCI IN PAIRS RARE GRAM POSITIVE RODS    Culture (A)  Final    60,000 COLONIES/mL Consistent with normal respiratory flora. Performed at [facility], [place] [address] [place], [place] [address]    Report Status 09/01/2016 FINAL  Final  Pneumocystis smear by DFA     Status: None   Collection Time: 08/29/16 10:44 AM  Result Value Ref Range Status   Specimen Source-PJSRC BRONCHIAL ALVEOLAR LAVAGE  Final    Comment: RT UPPER LOBE   Pneumocystis jiroveci Ag NEGATIVE  Final    Comment: Performed at [facility]  Acid Fast Smear (AFB)     Status: Abnormal   Collection Time: 08/29/16 10:44 AM  Result Value Ref Range Status   AFB Specimen Processing Concentration  Final   Acid Fast Smear Positive (A)  Final    Comment: CRITICAL RESULT CALLED TO, READ BACK BY AND VERIFIED WITH: DR Lake Bells AT 2103 08/30/16 BY N.THOMPSON (NOTE) 4+, more than 36 acid-fast bacilli per field at 400X magnification, fluorescent smear Reported/Faxed results to Wille Glaser. at [place] PM on 4.20.18  KP Faxed to [phone] Performed At: [facility] [place], [place] [id number] Lindon Romp MD [id number]    Source (AFB) BRONCHIAL ALVEOLAR LAVAGE  Final    Comment: RT UPPER LOBE  Acid Fast Culture with reflexed sensitivities     Status: Abnormal   Collection Time: 08/29/16 10:44 AM  Result Value Ref Range Status   Acid Fast Culture Positive (A)  Final    Comment: (NOTE) Acid-fast bacilli have been detected in culture at 2  weeks; see AFB Organism ID by DNA probe Performed At: [facility] [place], [place] [id number] Lindon Romp MD [id number]    Source of Sample BRONCHIAL ALVEOLAR LAVAGE  Final    Comment: RT UPPER LOBE  Fungus Culture With Stain     Status: None   Collection Time: 08/29/16 10:44 AM  Result Value Ref Range Status   Fungus Stain Final report  Final   Fungus (Mycology) Culture Preliminary report  Final    Comment: (NOTE) Performed At: [facility] [address] [place], [place] [id number] Evette Doffing  Darrick Penna MD [id number]:[id number]    Fungal Source BRONCHIAL ALVEOLAR LAVAGE  Final    Comment: RT UPPER LOBE  Fungus Culture Result     Status: None   Collection Time: 08/29/16 10:44 AM  Result Value Ref Range Status   Result 1 Comment  Final    Comment: (NOTE) KOH/Calcofluor preparation:  no fungus observed. Performed At: [facility] [place], [place] [id number] Lindon Romp MD [id number]   Fungal organism reflex     Status: None   Collection Time: 08/29/16 10:44 AM  Result Value Ref Range Status   Fungal result 1 Candida albicans  Final    Comment: (NOTE) 1-2 colonies                                            . Performed At: [facility] [place], [place] [id number] Lindon Romp MD [id number]   AFB Organism ID By DNA Probe     Status: Abnormal   Collection Time: 08/29/16 10:44 AM  Result Value Ref Range Status   M tuberculosis complex Positive (A)  Final    Comment: (NOTE) Reported to [facility] 09/09/16 @ 17 AE Faxed to [phone]    M avium complex Negative  Final   Jerilynn Mages kansasii Not Indicated  Final   M gordonae Not Indicated  Final   Other: NAIDN  Final    Comment: No additional identification testing is necessary.   Susceptibility Testing See reflex.  Final    Comment: (NOTE) Performed At: [facility] [place], [place] [id number] Lindon Romp MD  [id number]   Culture, bal-quantitative     Status: Abnormal   Collection Time: 08/29/16 10:49 AM  Result Value Ref Range Status   Specimen Description BRONCHIAL ALVEOLAR LAVAGE RT MIDDLE LOBE  Final   Special Requests Normal  Final   Gram Stain NO WBC SEEN NO ORGANISMS SEEN   Final   Culture (A)  Final    10,000 COLONIES/mL Consistent with normal respiratory flora. Performed at [facility], [place] [address], [place], [place] [address]    Report Status 09/01/2016 FINAL  Final  Acid Fast Smear (AFB)     Status: Abnormal   Collection Time: 08/29/16 10:49 AM  Result Value Ref Range Status   AFB Specimen Processing Concentration  Final   Acid Fast Smear Positive (A)  Final    Comment: CRITICAL RESULT CALLED TO, READ BACK BY AND VERIFIED WITH: DR Lake Bells AT 2103 08/30/16 BY N.THOMPSON (NOTE) 3+, 4-36 acid-fast bacilli per field at 400X magnification, fluorescent smear POSITIVE SMEAR REPORTED TO SHANNON P. AT 1125 ON 08/30/16 BY AM. Performed At: [facility] [place], [place] [id number] Lindon Romp MD [id number]    Source (AFB) BRONCHIAL ALVEOLAR LAVAGE  Final    Comment: RT MIDDLE LOBE  Fungus Culture With Stain     Status: None   Collection Time: 08/29/16 10:49 AM  Result Value Ref Range Status   Fungus Stain Final report  Final   Fungus (Mycology) Culture Preliminary report  Final    Comment: (NOTE) Performed At: [facility] [place], [place] [id number] Lindon Romp MD [id number]    Fungal Source BRONCHIAL ALVEOLAR LAVAGE  Final    Comment: RT MIDDLE LOBE  Fungus Culture Result     Status:  None   Collection Time: 08/29/16 10:49 AM  Result Value Ref Range Status   Result 1 Comment  Final    Comment: (NOTE) KOH/Calcofluor preparation:  no fungus observed. Performed At: [facility] [place], [place] [id number] Lindon Romp MD [id number]   Fungal organism reflex     Status:  None   Collection Time: 08/29/16 10:49 AM  Result Value Ref Range Status   Fungal result 1 Candida albicans  Final    Comment: (NOTE) 1-2 colonies                                            . Performed At: [facility] [place], [place] [id number] Lindon Romp MD [id number]   Blood Culture (routine x 2)     Status: None (Preliminary result)   Collection Time: 08/12/2016  1:47 PM  Result Value Ref Range Status   Specimen Description BLOOD LEFT ANTECUBITAL  Final   Special Requests   Final    BOTTLES DRAWN AEROBIC AND ANAEROBIC Blood Culture adequate volume   Culture   Final    NO GROWTH 4 DAYS Performed at [facility], [place] [address], [place], [place] [address]    Report Status PENDING  Incomplete  Blood Culture (routine x 2)     Status: None (Preliminary result)   Collection Time: 08/14/2016  1:57 PM  Result Value Ref Range Status   Specimen Description BLOOD RIGHT ANTECUBITAL  Final   Special Requests IN PEDIATRIC BOTTLE Blood Culture adequate volume  Final   Culture   Final    NO GROWTH 4 DAYS Performed at [facility], [place] [address] [place], [place] [address]    Report Status PENDING  Incomplete  Urine culture     Status: Abnormal   Collection Time: 09/08/2016  2:59 PM  Result Value Ref Range Status   Specimen Description URINE, RANDOM  Final   Special Requests NONE  Final   Culture (A)  Final    <10,000 COLONIES/mL INSIGNIFICANT GROWTH Performed at [facility], [place] [address] [place], [place] [address]    Report Status 09/08/2016 FINAL  Final  C difficile quick scan w PCR reflex     Status: None   Collection Time: 09/09/16 11:33 PM  Result Value Ref Range Status   C Diff antigen NEGATIVE NEGATIVE Final   C Diff toxin NEGATIVE NEGATIVE Final   C Diff interpretation No C. difficile detected.  Final    Studies/Results: No results found.    Assessment/Plan:  INTERVAL HISTORY: MTB PCR IS POSITIVE FROM  BRONCHOSCOPY   Active Problems:   Hypertension   AKI (acute kidney injury) ([place])   Dehydration    Lawrence Sanford is a 66 y.o. male with  AFB smear positive TB, confirmed by PCR (see EPIC labs from bronchoscopy) who was admitted with fatigue, weakness, fever here and leukocytosis being treated for superimposed PNA  #1 Pulmonary TB:  Continue RIPE  Continue airborne  Not sure why [facility] needs another MTB PCR when we have one that is positive (perhaps  A miscommunication)  #2 Fevers, malaise, leukocytosis:  This could be potentially a bit of immune response in setting of treating TB. There is an IRIS type phenomena that happens with treatment of TB even without underlying immune deficiency  Fine to continue CTX,  if he worsens at all would get repeat CT scan of the lungs  I DO NOT SEE THAT HE HAS HAD HIV TESTING???  I will order now     LOS: 4 days   Alcide Evener 09/10/2016, 10:17 AM

## 2016-09-10 NOTE — Care Management Note (Signed)
Case Management Note  Patient Details  Name: Lawrence Sanford MRN: [id number] Date of Birth: 07-05-1950  Subjective/Objective:           66 yo admitted with AKI, CAP and TB         Action/Plan: From home with spouse.  This CM reviewed physical therapy recommendations with pt and pt offered choice for HHPT. Pt chooses [facility]. Pt requesting RW for home use. Order received and [facility] rep contacted for referral and DME. CM will continue to follow.  Expected Discharge Date:   (unknown)               Expected Discharge Plan:  [place]  In-House Referral:     Discharge planning Services  CM Consult  Post Acute Care Choice:  Home Health Choice offered to:  Patient  DME Arranged:  [place] rolling DME Agency:  [place]:  [facility] Agency:  [place]  Status of Service:  In process, will continue to follow  If discussed at Long Length of Stay Meetings, dates discussed:    Additional Comments:  Lynnell Catalan, RN 09/10/2016, 1:53 PM

## 2016-09-10 NOTE — Progress Notes (Signed)
Patient ID: Lawrence Sanford, male   DOB: 1950-07-04, 66 y.o.   MRN: [id number]    PROGRESS NOTE  Lawrence Sanford  XLK:[id number] DOB: March 09, 1951 DOA: 08/13/2016  PCP: Pcp Not In System   Brief Narrative:    66 y.o. male with medical history significant for TB (has a health dept person who comes in to administer medications), history of hypertension. He presented to ED with worsening weakness and fatigue as well as poor po intake for past few days ago. Patient reported feeling more tired. No reports of chest pain but he does have a cough intermittently productive of yellowish sputum. No fevers. No chills. No reports of weight loss.  ED Course: In ED, BP was 126/81, HR 87-101, RR 18-29, oxygen saturation 80% which has improved to 95% with [place] oxygen support. Blood work showed WBC count of 17.7, hemoglobin of 8.5, platelets 711, creatinine 1.68, calcium 10.8, lactic acid WNL. CXR showed bilateral opacities. Will start empiric levaquin for possible superimposed pneumonia.  Assessment & Plan:   Active Problems: AKI (acute kidney injury) ([place]) / Dehydration  - Unclear etiology, possibly from TB meds verus Hyzaar (Hyzaar placed on hold) - stop IVF to see how pt does  - Cr is trending down and remains WNL - BMP in AM  Generalized weakness - Likely in the setting of TB and possibly from meds used for treatment of TB - PT eval requested   Possible community acquired pneumonia / Leukocytosis / ? Sepsis / FUO - Possible pneumonia superimposed on TB - Bilateral pulmonary opacities seen in CXR, stable cavitary RUL mass  - Started empiric Levaquin on admission, was changed to Rocephin 4/28, per ID rocephin was stopped 4/30 - WBC still up and pt with Tmax 99.8 F, unclear etiology  - HIV ordered, will also check lactic acid and procalcitonin, blood cultures  - appreciate ID team following   Diarrhea - improving, C. Diff negative - monitor   Cavitary right upper lung lobe lesion - S/P bronch 4/19  and BAL; Had positive AFB - Given TB meds (by health dept) - continue here - per PCCM, unless pt deteriorates clinically, no need for repeat bronch  - ID team consulted, assistance appreciated   Thrombocytosis - Likely reactive, stress demargination  - improving overall and WNL this am  Essential hypertension - Continue carvedilol - add hydralazine as needed   Hypokalemia - supplemented and WNL this AM   Hypercalcemia - Due to dehydration - resolved with IVF - BMP in AM  Anemia suspected to be from chronic disease, IDA - anemia panel with Iron level 14 - transfused two U PRBC 4/29, Hg up from 6.6 --> 9.8 --> 10.7 - CBC in AM  DVT prophylaxis: Lovenox SQ Code Status: Full  Family Communication: Patient at bedside  Disposition Plan: To be determined   Consultants:   ID   PCCM over the phone   Procedures:   None  Antimicrobials:   Levaquin 4/27 -->  4/28  changed to Rocephin 4/28 --> 4/30  Subjective: No events overnight. Pt reports feeling tired   Objective: Vitals:   09/09/16 1437 09/09/16 2028 09/10/16 0618 09/10/16 0836  BP: 137/87 124/73 (!) 156/92   Pulse: 97 (!) 102    Resp: 18 20 (!) 24   Temp: 98.1 F (36.7 C) 99.8 F (37.7 C) 99.3 F (37.4 C)   TempSrc: Oral Oral Oral   SpO2: 98% 95% 95% 95%  Weight:      Height:  Intake/Output Summary (Last 24 hours) at 09/10/16 1106 Last data filed at 09/10/16 1015  Gross per 24 hour  Intake             1680 ml  Output              500 ml  Net             1180 ml   Filed Weights   08/23/2016 1315 08/31/2016 1613 09/07/16 0125  Weight: 57.2 kg (126 lb) 57.2 kg (126 lb) 54.9 kg (121 lb 0.5 oz)    Examination:  General exam: Appears calm and comfortable  Respiratory system: Rhonchi at bases mild. No wheezing  Cardiovascular system: S1 & S2 heard, RRR. No JVD, murmurs, rubs, gallops or clicks.  Gastrointestinal system: Abdomen is nondistended, soft and nontender. No organomegaly or masses  felt. Normal bowel sounds heard. Central nervous system: Alert and oriented. No focal neurological deficits. Extremities: Symmetric 5 x 5 power.  Data Reviewed: I have personally reviewed following labs and imaging studies  CBC:  Recent Labs Lab 09/02/2016 1357 09/07/16 0436 09/08/16 0434 09/09/16 0414 09/10/16 0350  WBC 17.7* 17.2* 23.2* 23.2* 30.2*  NEUTROABS 13.9*  --   --   --   --   HGB 8.5* 7.7* 6.6* 9.8* 10.7*  HCT 26.5* 24.2* 21.0* 29.3* 32.0*  MCV 80.3 80.9 80.8 82.3 83.1  PLT 711* 606* 492* 394 672   Basic Metabolic Panel:  Recent Labs Lab 09/01/2016 1357 09/07/16 0436 09/08/16 0434 09/09/16 0414 09/10/16 0350  NA 135 139 137 140 137  K 3.8 3.7 3.4* 4.0 3.5  CL 98* 106 108 110 107  CO2 22 20* 21* 21* 19*  GLUCOSE 133* 118* 116* 104* 118*  BUN 65* 39* _0 CREATININE 1.68* 1.11 0.90 0.80 0.77  CALCIUM 10.8* 10.2 9.8 9.8 9.8   Liver Function Tests:  Recent Labs Lab 09/05/2016 1357 09/08/16 0434  AST 31 34  ALT 22 20  ALKPHOS 102 85  BILITOT 1.0 1.1  PROT 8.5* 6.8  ALBUMIN 3.0* 2.3*   CBG:  Recent Labs Lab 09/07/16 0747 09/08/16 0738 09/09/16 0740  GLUCAP 105* 101* 102*   Urine analysis:    Component Value Date/Time   COLORURINE YELLOW 08/17/2016 1459   APPEARANCEUR CLEAR 09/05/2016 1459   LABSPEC 1.014 08/25/2016 1459   PHURINE 5.0 08/24/2016 1459   GLUCOSEU 50 (A) 09/02/2016 1459   HGBUR SMALL (A) 08/16/2016 1459   BILIRUBINUR NEGATIVE 08/16/2016 1459   KETONESUR 5 (A) 08/18/2016 1459   PROTEINUR 30 (A) 08/23/2016 1459   NITRITE NEGATIVE 08/22/2016 1459   LEUKOCYTESUR NEGATIVE 08/20/2016 1459   Recent Results (from the past 240 hour(s))  Blood Culture (routine x 2)     Status: None (Preliminary result)   Collection Time: 08/13/2016  1:47 PM  Result Value Ref Range Status   Specimen Description BLOOD LEFT ANTECUBITAL  Final   Special Requests   Final    BOTTLES DRAWN AEROBIC AND ANAEROBIC Blood Culture adequate volume   Culture    Final    NO GROWTH 4 DAYS Performed at [facility], [place] [address], [place], [place] [address]    Report Status PENDING  Incomplete  Blood Culture (routine x 2)     Status: None (Preliminary result)   Collection Time: 09/08/2016  1:57 PM  Result Value Ref Range Status   Specimen Description BLOOD RIGHT ANTECUBITAL  Final   Special Requests IN PEDIATRIC BOTTLE Blood Culture  adequate volume  Final   Culture   Final    NO GROWTH 4 DAYS Performed at [facility], [place] [address] [place], [place] [address]    Report Status PENDING  Incomplete  Urine culture     Status: Abnormal   Collection Time: 08/11/2016  2:59 PM  Result Value Ref Range Status   Specimen Description URINE, RANDOM  Final   Special Requests NONE  Final   Culture (A)  Final    <10,000 COLONIES/mL INSIGNIFICANT GROWTH Performed at [facility], [place] [address], [place], [place] [address]    Report Status 09/08/2016 FINAL  Final  C difficile quick scan w PCR reflex     Status: None   Collection Time: 09/09/16 11:33 PM  Result Value Ref Range Status   C Diff antigen NEGATIVE NEGATIVE Final   C Diff toxin NEGATIVE NEGATIVE Final   C Diff interpretation No C. difficile detected.  Final      Radiology Studies: No results found. Scheduled Meds: . carvedilol  6.25 mg Oral Daily  . isoniazid  300 mg Oral Q supper   And  . vitamin B-6  50 mg Oral Q supper   And  . rifampin  600 mg Oral Q supper   And  . pyrazinamide  1,000 mg Oral Q supper   And  . ethambutol  800 mg Oral Q supper  . ferrous sulfate  325 mg Oral BID WC  . guaiFENesin  600 mg Oral Daily  . mometasone-formoterol  2 puff Inhalation BID  . sodium chloride flush  3 mL Intravenous Q12H   Continuous Infusions: . sodium chloride    . sodium chloride    . cefTRIAXone (ROCEPHIN)  IV Stopped (09/09/16 2330)    LOS: 4 days   Time spent: 20 minutes   Faye Ramsay, MD [facility] Pager [phone]  If 7PM-7AM, please  contact night-coverage [email or web address] Password [email or web address] 09/10/2016, 11:06 AM

## 2016-09-10 NOTE — Progress Notes (Signed)
CRITICAL VALUE ALERT  Critical value received:  Lactic Acid 2.0  Date of notification:  09/10/16  Time of notification:  1748  Critical value read back:Yes.    Nurse who received alert:  Laural Benes  MD notified (1st page):  Dr Doyle Askew  Time of first page:  1749  MD notified (2nd page):  Time of second page:  Responding MD:  Dr. Doyle Askew  Time MD responded:  [phone]

## 2016-09-10 NOTE — Care Management Important Message (Addendum)
Important Message  Patient Details IM Letter given to [name]/Case Manager to present to Patient Name: Lawrence Sanford MRN: [id number] Date of Birth: 1950-07-06   Medicare Important Message Given:  Yes    [name] 09/10/2016, 11:18 AMImportant Message  Patient Details  Name: Lawrence Sanford MRN: [id number] Date of Birth: 1950/06/28   Medicare Important Message Given:  Yes    [name] 09/10/2016, 11:18 AM

## 2016-09-10 NOTE — H&P (Signed)
Pt presents for bronch for evaluation of lung cavitary lesion See H/P from clinic for full details  Blood pressure (!) 89/58, temperature 97.6 F (36.4 C), temperature source Other (Comment), resp. rate (!) 34, SpO2 93 %. Gen:      No acute distress HEENT:  EOMI, sclera anicteric Neck:     No masses; no thyromegaly Lungs:    Clear to auscultation bilaterally; normal respiratory effort CV:         Regular rate and rhythm; no murmurs Abd:      + bowel sounds; soft, non-tender; no palpable masses, no distension Ext:    No edema; adequate peripheral perfusion Skin:      Warm and dry; no rash Neuro: alert and oriented x 3 Psych: normal mood and affect  Risk, benefit of procedure explained to patient. He is agreeable to proceed.  Marshell Garfinkel MD [facility] Pager [phone] If no answer or after 3pm call: [phone] 09/10/2016, 1:27 PM

## 2016-09-10 DEATH — deceased

## 2016-09-11 ENCOUNTER — Inpatient Hospital Stay (HOSPITAL_COMMUNITY): Payer: Medicare HMO

## 2016-09-11 DIAGNOSIS — Z113 Encounter for screening for infections with a predominantly sexual mode of transmission: Secondary | ICD-10-CM

## 2016-09-11 DIAGNOSIS — A150 Tuberculosis of lung: Secondary | ICD-10-CM

## 2016-09-11 DIAGNOSIS — R0602 Shortness of breath: Secondary | ICD-10-CM

## 2016-09-11 DIAGNOSIS — J159 Unspecified bacterial pneumonia: Secondary | ICD-10-CM

## 2016-09-11 DIAGNOSIS — B370 Candidal stomatitis: Secondary | ICD-10-CM

## 2016-09-11 LAB — CBC
HEMATOCRIT: 32 % — AB (ref 39.0–52.0)
Hemoglobin: 10.5 g/dL — ABNORMAL LOW (ref 13.0–17.0)
MCH: 27.9 pg (ref 26.0–34.0)
MCHC: 32.8 g/dL (ref 30.0–36.0)
MCV: 85.1 fL (ref 78.0–100.0)
Platelets: 330 10*3/uL (ref 150–400)
RBC: 3.76 MIL/uL — ABNORMAL LOW (ref 4.22–5.81)
RDW: 16.2 % — AB (ref 11.5–15.5)
WBC: 27.8 10*3/uL — ABNORMAL HIGH (ref 4.0–10.5)

## 2016-09-11 LAB — LACTIC ACID, PLASMA
Lactic Acid, Venous: 3.1 mmol/L (ref 0.5–1.9)
Lactic Acid, Venous: 2.4 mmol/L (ref 0.5–1.9)
Lactic Acid, Venous: 2.4 mmol/L (ref 0.5–1.9)
Lactic Acid, Venous: 1.2 mmol/L (ref 0.5–1.9)

## 2016-09-11 LAB — HIV ANTIBODY (ROUTINE TESTING W REFLEX): HIV SCREEN 4TH GENERATION: NONREACTIVE

## 2016-09-11 LAB — CULTURE, BLOOD (ROUTINE X 2)
Culture: NO GROWTH
Culture: NO GROWTH
SPECIAL REQUESTS: ADEQUATE
Special Requests: ADEQUATE

## 2016-09-11 LAB — CD4/CD8 (T-HELPER/T-SUPPRESSOR CELL)
CD4 absolute: 1440 /uL (ref 500–1900)
CD4%: 56 % (ref 30.0–60.0)
CD8 T Cell Abs: 570 /uL (ref 230–1000)
CD8tox: 22 % (ref 15.0–40.0)
Ratio: 2.5 (ref 1.0–3.0)
TOTAL LYMPHOCYTE COUNT: 2560 /uL (ref 1000–4000)

## 2016-09-11 LAB — BASIC METABOLIC PANEL
Anion gap: 11 (ref 5–15)
BUN: 14 mg/dL (ref 6–20)
CHLORIDE: 107 mmol/L (ref 101–111)
CO2: 20 mmol/L — AB (ref 22–32)
CREATININE: 0.98 mg/dL (ref 0.61–1.24)
Calcium: 9.7 mg/dL (ref 8.9–10.3)
GFR calc Af Amer: >60 mL/min (ref >60)
GFR calc non Af Amer: >60 mL/min (ref >60)
GLUCOSE: 165 mg/dL — AB (ref 65–99)
Potassium: 3.6 mmol/L (ref 3.5–5.1)
Sodium: 138 mmol/L (ref 135–145)

## 2016-09-11 LAB — HCV COMMENT:

## 2016-09-11 LAB — HEPATITIS B SURFACE ANTIGEN: HEP B S AG: NEGATIVE

## 2016-09-11 LAB — HEPATITIS C ANTIBODY (REFLEX): HCV Ab: 0.1 s/co ratio (ref 0.0–0.9)

## 2016-09-11 LAB — GLUCOSE, CAPILLARY: Glucose-Capillary: 112 mg/dL — ABNORMAL HIGH (ref 65–99)

## 2016-09-11 MED ORDER — FLUCONAZOLE 100 MG PO TABS
200.0000 mg | ORAL_TABLET | Freq: Every day | ORAL | Status: DC
  Administered 2016-09-11 – 2016-09-12 (×2): 200 mg via ORAL
  Filled 2016-09-11 (×3): qty 2

## 2016-09-11 MED ORDER — SODIUM CHLORIDE 0.9 % IV BOLUS (SEPSIS)
1000.0000 mL | Freq: Once | INTRAVENOUS | Status: AC
  Administered 2016-09-11: 1000 mL via INTRAVENOUS

## 2016-09-11 NOTE — Progress Notes (Signed)
OT Cancellation Note  Patient Details Name: Lawrence Sanford MRN: [id number] DOB: 11-Dec-1950   Cancelled Treatment:    Reason Eval/Treat Not Completed: Other (comment).  Noted pt only tolerated walking 8 feet with PT with elevated HR.  Will check back tomorrow.  [name] 09/11/2016, 3:36 PM  Lesle Chris, OTR/L [phone] 09/11/2016

## 2016-09-11 NOTE — Progress Notes (Signed)
Subjective:   Is having dysphagia and also c/o SOB    Antibiotics:  Anti-infectives    Start     Dose/Rate Route Frequency Ordered Stop   09/11/16 1000  fluconazole (DIFLUCAN) tablet 200 mg     200 mg Oral Daily 09/11/16 0959     09/10/16 1700  isoniazid (NYDRAZID) tablet 300 mg     300 mg Oral Daily with supper 09/10/16 0924     09/10/16 1700  rifampin (RIFADIN) capsule 600 mg     600 mg Oral Daily with supper 09/10/16 0924     09/10/16 1700  pyrazinamide tablet 1,000 mg     1,000 mg Oral Daily with supper 09/10/16 0924     09/10/16 1700  ethambutol (MYAMBUTOL) tablet 800 mg     800 mg Oral Daily with supper 09/10/16 0924     09/07/16 2200  levofloxacin (LEVAQUIN) IVPB 750 mg  Status:  Discontinued     750 mg 100 mL/hr over 90 Minutes Intravenous Every 24 hours 09/07/16 1340 09/07/16 1527   09/07/16 2200  cefTRIAXone (ROCEPHIN) 1 g in dextrose 5 % 50 mL IVPB     1 g 100 mL/hr over 30 Minutes Intravenous Every 24 hours 09/07/16 1527     09/07/16 2200  isoniazid (NYDRAZID) tablet 300 mg  Status:  Discontinued     300 mg Oral Daily at bedtime 09/07/16 1527 09/10/16 0924   09/07/16 2200  rifampin (RIFADIN) capsule 600 mg  Status:  Discontinued     600 mg Oral Daily at bedtime 09/07/16 1527 09/10/16 0924   09/07/16 2200  pyrazinamide tablet 1,000 mg  Status:  Discontinued     1,000 mg Oral Daily at bedtime 09/07/16 1527 09/10/16 0924   09/07/16 2200  ethambutol (MYAMBUTOL) tablet 800 mg  Status:  Discontinued     800 mg Oral Daily at bedtime 09/07/16 1527 09/10/16 0924   08/18/2016 2200  levofloxacin (LEVAQUIN) IVPB 750 mg  Status:  Discontinued     750 mg 100 mL/hr over 90 Minutes Intravenous Every 48 hours 09/07/2016 2040 09/07/16 1340      Medications: Scheduled Meds: . carvedilol  6.25 mg Oral Daily  . isoniazid  300 mg Oral Q supper   And  . vitamin B-6  50 mg Oral Q supper   And  . rifampin  600 mg Oral Q supper   And  . pyrazinamide  1,000 mg Oral Q supper    And  . ethambutol  800 mg Oral Q supper  . feeding supplement (ENSURE ENLIVE)  237 mL Oral BID BM  . ferrous sulfate  325 mg Oral BID WC  . fluconazole  200 mg Oral Daily  . guaiFENesin  600 mg Oral Daily  . mometasone-formoterol  2 puff Inhalation BID  . sodium chloride flush  3 mL Intravenous Q12H   Continuous Infusions: . sodium chloride    . sodium chloride    . cefTRIAXone (ROCEPHIN)  IV Stopped (09/10/16 2141)   PRN Meds:.acetaminophen **OR** acetaminophen, ondansetron **OR** ondansetron (ZOFRAN) IV    Objective: Weight change:   Intake/Output Summary (Last 24 hours) at 09/11/16 1026 Last data filed at 09/11/16 0931  Gross per 24 hour  Intake              200 ml  Output              100 ml  Net  100 ml   Blood pressure 120/83, pulse (!) 107, temperature 99.3 F (37.4 C), temperature source Oral, resp. rate 17, height _0  (1.753 m), weight 113 lb 12.1 oz (51.6 kg), SpO2 98 %. Temp:  [97.8 F (36.6 C)-99.3 F (37.4 C)] 99.3 F (37.4 C) (05/02 0424) Pulse Rate:  [95-107] 107 (05/02 0424) Resp:  [16-96] 17 (05/02 0424) BP: (107-135)/(68-84) 120/83 (05/02 0424) SpO2:  [97 %-98 %] 98 % (05/02 0856) Weight:  [113 lb 12.1 oz (51.6 kg)] 113 lb 12.1 oz (51.6 kg) (05/02 0424)  Physical Exam: General: Alert and awake, oriented x3, not in any acute distress. HEENT: anicteric sclera, pupils reactive to light and accommodation, EOMI, he has thrush in OP  09/11/16:      CVS regular rate, normal r,  no murmur rubs or gallops Chest: diminished breath sounds at the bases Abdomen: soft nontender, nondistended, normal bowel sounds, Extremities: Skin: no rashes Neuro: nonfocal  CBC:  CBC    Component Value Date/Time   WBC 27.8 (H) 09/11/2016 0958   RBC 3.76 (L) 09/11/2016 0958   HGB 10.5 (L) 09/11/2016 0958   HCT 32.0 (L) 09/11/2016 0958   PLT 330 09/11/2016 0958   MCV 85.1 09/11/2016 0958   MCH 27.9 09/11/2016 0958   MCHC 32.8 09/11/2016 0958    RDW 16.2 (H) 09/11/2016 0958   LYMPHSABS 1.1 08/27/2016 1357   MONOABS 2.7 (H) 09/01/2016 1357   EOSABS 0.0 08/31/2016 1357   BASOSABS 0.0 08/19/2016 1357      BMET  Recent Labs  09/09/16 0414 09/10/16 0350  NA 140 137  K 4.0 3.5  CL 110 107  CO2 21* 19*  GLUCOSE 104* 118*  BUN 15 10  CREATININE 0.80 0.77  CALCIUM 9.8 9.8     Liver Panel  No results for input(s): PROT, ALBUMIN, AST, ALT, ALKPHOS, BILITOT, BILIDIR, IBILI in the last 72 hours.     Sedimentation Rate No results for input(s): ESRSEDRATE in the last 72 hours. C-Reactive Protein No results for input(s): CRP in the last 72 hours.  Micro Results: Recent Results (from the past 720 hour(s))  Respiratory or Resp and Sputum Culture     Status: None   Collection Time: 08/26/16 11:37 AM  Result Value Ref Range Status   Gram Stain Abundant  Final   Gram Stain WBC present-predominately PMN  Final   Gram Stain No Squamous Epithelial Cells Seen  Final   Gram Stain Rare Gram Negative Rods  Final   Gram Stain Rare Gram Positive Cocci In Pairs  Final   Organism ID, Bacteria Normal Oropharyngeal Flora  Final  Fungus Culture & Smear     Status: None (Preliminary result)   Collection Time: 08/26/16 11:37 AM  Result Value Ref Range Status   Source: SPUTUM  Preliminary   Fungus (Mycology) Culture    Preliminary    Comment:   CULTURE, FUNGUS W/SMEAR NOT HAIR, SKIN, BLOOD       MICRO NUMBER:      [id number]   TEST STATUS:       PRELIMINARY   SPECIMEN SOURCE:   SPUTUM   SPECIMEN QUALITY:  ADEQUATE   SMEAR:             No fungal elements seen.   RESULT:            Scant growth of Yeast Isolated.  Please contact the laboratory within 3 days if                      further identification is desired.   AFB Culture & Smear     Status: Abnormal (Preliminary result)   Collection Time: 08/26/16 11:37 AM  Result Value Ref Range Status   Source: SPUTUM  Preliminary   Acid Fast Bacilli (AFB) Culture    (A)  Preliminary    Comment:   MYCOBACTERIA, CULTURE, WITH FLUOROCHROME SMEAR       MICRO NUMBER:      [id number]   TEST STATUS:       PRELIMINARY   SPECIMEN SOURCE:   SPUTUM   SPECIMEN QUALITY:  ADEQUATE   SMEAR:             Many (4 +) acid-fast bacilli seen using the                      fluorochrome method.   RESULT:            Acid-fast bacilli present in liquid culture media.                      Identification to follow.   Culture, bal-quantitative     Status: Abnormal   Collection Time: 08/29/16 10:44 AM  Result Value Ref Range Status   Specimen Description BRONCHIAL ALVEOLAR LAVAGE RIGHT UPPER LUNG  Final   Special Requests Normal  Final   Gram Stain   Final    ABUNDANT WBC PRESENT, PREDOMINANTLY PMN FEW GRAM POSITIVE COCCI IN PAIRS RARE GRAM POSITIVE RODS    Culture (A)  Final    60,000 COLONIES/mL Consistent with normal respiratory flora. Performed at [facility], [place] [address], [place], [place] [address]    Report Status 09/01/2016 FINAL  Final  Pneumocystis smear by DFA     Status: None   Collection Time: 08/29/16 10:44 AM  Result Value Ref Range Status   Specimen Source-PJSRC BRONCHIAL ALVEOLAR LAVAGE  Final    Comment: RT UPPER LOBE   Pneumocystis jiroveci Ag NEGATIVE  Final    Comment: Performed at [facility]  Acid Fast Smear (AFB)     Status: Abnormal   Collection Time: 08/29/16 10:44 AM  Result Value Ref Range Status   AFB Specimen Processing Concentration  Final   Acid Fast Smear Positive (A)  Final    Comment: CRITICAL RESULT CALLED TO, READ BACK BY AND VERIFIED WITH: DR Lake Bells AT 2103 08/30/16 BY N.THOMPSON (NOTE) 4+, more than 36 acid-fast bacilli per field at 400X magnification, fluorescent smear Reported/Faxed results to Wille Glaser. at [place] PM on 4.20.18  KP Faxed to [phone] Performed At: [facility] [place], [place] [id number] Lindon Romp MD DP:[id number]    Source (AFB) BRONCHIAL ALVEOLAR  LAVAGE  Final    Comment: RT UPPER LOBE  Acid Fast Culture with reflexed sensitivities     Status: Abnormal   Collection Time: 08/29/16 10:44 AM  Result Value Ref Range Status   Acid Fast Culture Positive (A)  Final    Comment: (NOTE) Acid-fast bacilli have been detected in culture at 2 weeks; see AFB Organism ID by DNA probe Performed At: [facility] [place], [place] [id number] Lindon Romp MD PY:[id number]    Source of Sample BRONCHIAL ALVEOLAR LAVAGE  Final    Comment: RT UPPER  LOBE  Fungus Culture With Stain     Status: None   Collection Time: 08/29/16 10:44 AM  Result Value Ref Range Status   Fungus Stain Final report  Final   Fungus (Mycology) Culture Preliminary report  Final    Comment: (NOTE) Performed At: [facility] [place], [place] [id number] Lindon Romp MD WU:[id number]    Fungal Source BRONCHIAL ALVEOLAR LAVAGE  Final    Comment: RT UPPER LOBE  Fungus Culture Result     Status: None   Collection Time: 08/29/16 10:44 AM  Result Value Ref Range Status   Result 1 Comment  Final    Comment: (NOTE) KOH/Calcofluor preparation:  no fungus observed. Performed At: [facility] [place], [place] [id number] Lindon Romp MD VH:[id number]   Fungal organism reflex     Status: None   Collection Time: 08/29/16 10:44 AM  Result Value Ref Range Status   Fungal result 1 Candida albicans  Final    Comment: (NOTE) 1-2 colonies                                            . Performed At: [facility] [place], [place] [id number] Lindon Romp MD UV:[id number]   AFB Organism ID By DNA Probe     Status: Abnormal   Collection Time: 08/29/16 10:44 AM  Result Value Ref Range Status   M tuberculosis complex Positive (A)  Final    Comment: (NOTE) Reported to [facility] 09/09/16 @ 31 AE Faxed to [phone]    M avium complex Negative  Final   Jerilynn Mages k[place]ii Not Indicated   Final   M gordonae Not Indicated  Final   Other: NAIDN  Final    Comment: No additional identification testing is necessary.   Susceptibility Testing See reflex.  Final    Comment: (NOTE) Performed At: [facility] [place], [place] [id number] Lindon Romp MD VF:[id number]   Culture, bal-quantitative     Status: Abnormal   Collection Time: 08/29/16 10:49 AM  Result Value Ref Range Status   Specimen Description BRONCHIAL ALVEOLAR LAVAGE RT MIDDLE LOBE  Final   Special Requests Normal  Final   Gram Stain NO WBC SEEN NO ORGANISMS SEEN   Final   Culture (A)  Final    10,000 COLONIES/mL Consistent with normal respiratory flora. Performed at [facility], [place] [address], [place], [place] [address]    Report Status 09/01/2016 FINAL  Final  Acid Fast Smear (AFB)     Status: Abnormal   Collection Time: 08/29/16 10:49 AM  Result Value Ref Range Status   AFB Specimen Processing Concentration  Final   Acid Fast Smear Positive (A)  Final    Comment: CRITICAL RESULT CALLED TO, READ BACK BY AND VERIFIED WITH: DR Lake Bells AT 2103 08/30/16 BY N.THOMPSON (NOTE) 3+, 4-36 acid-fast bacilli per field at 400X magnification, fluorescent smear POSITIVE SMEAR REPORTED TO SHANNON P. AT 1125 ON 08/30/16 BY AM. Performed At: [facility] [place], [place] [id number] Lindon Romp MD NA:[id number]    Source (AFB) BRONCHIAL ALVEOLAR LAVAGE  Final    Comment: RT MIDDLE LOBE  Fungus Culture With Stain     Status: None   Collection Time: 08/29/16 10:49 AM  Result Value Ref Range Status   Fungus Stain Final  report  Final   Fungus (Mycology) Culture Preliminary report  Final    Comment: (NOTE) Performed At: [facility] [place], [place] [id number] Lindon Romp MD CM:[id number]    Fungal Source BRONCHIAL ALVEOLAR LAVAGE  Final    Comment: RT MIDDLE LOBE  Fungus Culture Result     Status: None   Collection Time:  08/29/16 10:49 AM  Result Value Ref Range Status   Result 1 Comment  Final    Comment: (NOTE) KOH/Calcofluor preparation:  no fungus observed. Performed At: [facility] [place], [place] [id number] Lindon Romp MD KP:[id number]   Fungal organism reflex     Status: None   Collection Time: 08/29/16 10:49 AM  Result Value Ref Range Status   Fungal result 1 Candida albicans  Final    Comment: (NOTE) 1-2 colonies                                            . Performed At: [facility] [place], [place] [id number] Lindon Romp MD EO:[id number]   Blood Culture (routine x 2)     Status: None (Preliminary result)   Collection Time: 09/05/2016  1:47 PM  Result Value Ref Range Status   Specimen Description BLOOD LEFT ANTECUBITAL  Final   Special Requests   Final    BOTTLES DRAWN AEROBIC AND ANAEROBIC Blood Culture adequate volume   Culture   Final    NO GROWTH 4 DAYS Performed at [facility], [place] [address] [place], [place] [address]    Report Status PENDING  Incomplete  Blood Culture (routine x 2)     Status: None (Preliminary result)   Collection Time: 08/11/2016  1:57 PM  Result Value Ref Range Status   Specimen Description BLOOD RIGHT ANTECUBITAL  Final   Special Requests IN PEDIATRIC BOTTLE Blood Culture adequate volume  Final   Culture   Final    NO GROWTH 4 DAYS Performed at [facility], [place] [address] [place], [place] [address]    Report Status PENDING  Incomplete  Urine culture     Status: Abnormal   Collection Time: 09/09/2016  2:59 PM  Result Value Ref Range Status   Specimen Description URINE, RANDOM  Final   Special Requests NONE  Final   Culture (A)  Final    <10,000 COLONIES/mL INSIGNIFICANT GROWTH Performed at [facility], [place] [address] [place], [place] [address]    Report Status 09/08/2016 FINAL  Final  C difficile quick scan w PCR reflex     Status: None   Collection Time: 09/09/16 11:33  PM  Result Value Ref Range Status   C Diff antigen NEGATIVE NEGATIVE Final   C Diff toxin NEGATIVE NEGATIVE Final   C Diff interpretation No C. difficile detected.  Final    Studies/Results: No results found.    Assessment/Plan:  INTERVAL HISTORY: MTB PCR IS POSITIVE FROM BRONCHOSCOPY   Active Problems:   Hypertension   AKI (acute kidney injury) ([place])   Dehydration   Pulmonary TB   Bacterial pneumonia   Routine screening for STI (sexually transmitted infection)   IRIS (immune reconstitution inflammatory syndrome) ([place])    Lawrence Sanford is a 66 y.o. male with  AFB smear positive TB, confirmed by PCR (see EPIC labs from bronchoscopy) who was admitted  with fatigue, weakness, fever here and leukocytosis being treated for superimposed PNA  #1 Pulmonary TB:  Continue RIPE  Continue airborne  Not sure why [facility] needs another MTB PCR when we have one that is positive (perhaps  A miscommunication)  #2 Fevers, malaise, leukocytosis:  This could be potentially a bit of immune response in setting of treating TB. There is an IRIS type phenomena that happens with treatment of TB even without underlying immune deficiency  Fine to continue CTX, if he worsens at all would get repeat CT scan of the lungs  HIV ag/ab test pending  #3 Thrush: will give fluconazole. Could be due to antibiotics but he could have HIV and AIDS though he claims he tested negative 7 months ago. He then claimed he is tested every 6 months by his doctor though states he only has sex with his wife--which could be a risk factor obviously if his wife has HIV. Need to make sure we get HIV test back and if + get him plugged into care if +   LOS: 5 days   Alcide Evener 09/11/2016, 10:26 AM

## 2016-09-11 NOTE — Progress Notes (Signed)
Dr Lonny Prude notified of the white coated tongue and c.p of throat tenderness like " 2 knots in the back of his throat"

## 2016-09-11 NOTE — Progress Notes (Signed)
CRITICAL VALUE ALERT  Critical value received:  1027  Date of notification: [id number]  Time of notification:  4034  Critical value read back:Yes.    Nurse who received alerttVicki  Vishaal Strollo RN  MD notified (1st page) DR Lonny Prude  Time of first page: 1045  MD notified (2nd page):  Time of second page:  Responding MD:  Dr Lonny Prude  Time MD responded:  1045

## 2016-09-11 NOTE — Progress Notes (Signed)
Dr Lonny Prude notified that lactic acid was 3.1

## 2016-09-11 NOTE — Progress Notes (Signed)
Dr Lonny Prude  Made aware that Mr Lawrence Sanford was C/O  of inability to breath. O2 sat 85. Lungs clear and diminished. He ordered O2.Sats are up to 95%. Mr Lawrence Sanford is resting with any SOB

## 2016-09-11 NOTE — Progress Notes (Signed)
Physical Therapy Treatment Patient Details Name: Lawrence Sanford MRN: [id number] DOB: Mar 09, 1951 Today's Date: 09/11/2016    History of Present Illness 66 y.o. 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 He presented to ED with worsening weakness and fatigue as well as poor po intake for past few days. Dx of dehydration, AKI, R upper lobe lesion.    PT Comments    Arrived to room same time as Therapist, sports.  Pt sitting EOB c/o SOB.  RA at rest 88% with HR 119.  Assisted with amb a very short distance RA decreased to 85% and HR increased to 129.  Noted 3/4 dyspnea.  RN applying nasal 2 lts.  Pt declined further activity.    Follow Up Recommendations  Home health PT     Equipment Recommendations  Rolling walker with 5" wheels;Wheelchair (measurements PT)    Recommendations for Other Services       Precautions / Restrictions Precautions Precautions: Fall Precaution Comments: monitor sats Restrictions Weight Bearing Restrictions: No    Mobility  Bed Mobility Overal bed mobility: Modified Independent             General bed mobility comments: sitting EOB Indep on arrival  Transfers Overall transfer level: Needs assistance Equipment used: None Transfers: Sit to/from [employer] Transfers Sit to Stand: Min guard;Supervision Stand pivot transfers: Supervision;Min guard       General transfer comment: min/guard for safety and use of hands to staedy self  Ambulation/Gait Ambulation/Gait assistance: Min assist Ambulation Distance (Feet): 8 Feet Assistive device: 1 person hand held assist;Rolling walker (2 wheeled) Gait Pattern/deviations: Step-to pattern;Step-through pattern;Narrow base of support Gait velocity: decreased   General Gait Details: LOB x 2 and limited amb distance due to MAX c/o dyspnea, RA 85% andf HR 129.  RN in room and aware.   Stairs             Wheelchair Mobility    Modified Rankin (Stroke Patients Only)       Balance                                            Cognition Arousal/Alertness: Awake/alert Behavior During Therapy: WFL for tasks assessed/performed Overall Cognitive Status: Within Functional Limits for tasks assessed                                        Exercises      General Comments        Pertinent Vitals/Pain Pain Assessment: No/denies pain    Home Living                      Prior Function            PT Goals (current goals can now be found in the care plan section) Progress towards PT goals: Progressing toward goals    Frequency    Min 3X/week      PT Plan Current plan remains appropriate    Co-evaluation              AM-PAC PT "6 Clicks" Daily Activity  Outcome Measure  Difficulty turning over in bed (including adjusting bedclothes, sheets and blankets)?: None Difficulty moving from  lying on back to sitting on the side of the bed? : None Difficulty sitting down on and standing up from a chair with arms (e.g., wheelchair, bedside commode, etc,.)?: None Help needed moving to and from a bed to chair (including a wheelchair)?: A Little Help needed walking in hospital room?: A Little Help needed climbing 3-5 steps with a railing? : A Lot 6 Click Score: 20    End of Session Equipment Utilized During Treatment: Gait belt Activity Tolerance: Patient limited by fatigue;Other (comment) (dyspnea) Patient left: in bed;with call bell/phone within reach Nurse Communication:  (low RA sats and elevated HR) PT Visit Diagnosis: Unsteadiness on feet (R26.81);Difficulty in walking, not elsewhere classified (R26.2)     Time: 5520-8022 PT Time Calculation (min) (ACUTE ONLY): 17 min  Charges:  $Gait Training: 8-22 mins                    G Codes:       Rica Koyanagi  PTA [facility]  Acute  Rehab Pager      [phone]

## 2016-09-11 NOTE — Progress Notes (Signed)
CRITICAL VALUE ALERT  Critical value received:  Lactic acid  Date of notification:  [id number]  Time of notification:1744  Critical value read back:Yes.    Nurse who received alert: Aloha Gell  MD notified (1st page)  Dr Lonny Prude  Time of first page:  1745  MD notified (2nd page):  Time of second page Responding MD:    Time MD responded:

## 2016-09-11 NOTE — Progress Notes (Addendum)
PROGRESS NOTE    Lawrence Sanford  CBJ:[id number] DOB: 14-Jul-1950 DOA: 08/25/2016 PCP: Pcp Not In System   Brief Narrative: Lawrence Sanford is a 66 y.o. male with a history of TB, hypertension. Patient presented with cough with concern for pneumonia and found to have an acute kidney injury.   Assessment & Plan:   Active Problems:   Hypertension   AKI (acute kidney injury) ([place])   Dehydration   Pulmonary TB   Bacterial pneumonia   Routine screening for STI (sexually transmitted infection)   IRIS (immune reconstitution inflammatory syndrome) ([place])   Thrush   Active pulmonary TB Cavitary lesion seen on x-ray. -continue RIPE -infectious disease recommendations -HIV pending -hepatitis C pending  Acute kidney injury Dehydration Lactic acid trended up. Creatinine improved. -repeat lactic acid -IV NS bolus if LA elevated  Generalized weakness Patient is very deconditioned and appears malnurished -dietitian consult  ?community acquired pneumonia Bilateral lung infiltrates -continue ceftriaxone  Diarrhea Improved.  Thrombocytosis Resolved.  Essential hypertension -continue carvedilol -continue hydralazine prn  Hypokalemia Resolved  Hypercalcemia Resolved  Anemia of chronic disease s/p 2 units PRBC. Stable.  Oral thrush Started on fluconazole   DVT prophylaxis: SCDs Code Status: Full code Family Communication: None at bedside Disposition Plan: Discharge pending medical stability   Consultants:   Infectious disease  Procedures:   PRBC  Antimicrobials:  Ceftriaxone (4/28>>  Levaquin (4/27)  Rifampin (4/28>>   Isoniazid  (4/28>>  Pyrazinamide (4/28>>  Ethambutol (4/28>>  Fluconazole (5/2>>   Subjective: Patient reports dyspnea and residual chest pain that is improved. He has a non-productive minor cough.  Objective: Vitals:   09/10/16 2107 09/10/16 2134 09/11/16 0424 09/11/16 0856  BP: 135/84  120/83   Pulse: (!) 107  (!) 107     Resp: 16  17   Temp: 98 F (36.7 C)  99.3 F (37.4 C)   TempSrc: Oral  Oral   SpO2: 97% 97% 98% 98%  Weight:   51.6 kg (113 lb 12.1 oz)   Height:        Intake/Output Summary (Last 24 hours) at 09/11/16 1346 Last data filed at 09/11/16 0931  Gross per 24 hour  Intake              200 ml  Output              100 ml  Net              100 ml   Filed Weights   09/05/2016 1613 09/07/16 0125 09/11/16 0424  Weight: 57.2 kg (126 lb) 54.9 kg (121 lb 0.5 oz) 51.6 kg (113 lb 12.1 oz)    Examination:  General exam: Appears calm and comfortable. Very thin Respiratory system: Clear to auscultation. Decreased breath sounds on right middle/lower fields. Respiratory effort normal. Cardiovascular system: S1 & S2 heard, RRR. No murmurs, rubs, gallops or clicks. Gastrointestinal system: Abdomen is nondistended, soft and nontender. Normal bowel sounds heard. Central nervous system: Alert and oriented. No focal neurological deficits. Extremities: No edema. No calf tenderness Skin: No cyanosis. No rashes Psychiatry: Judgement and insight appear normal. Mood & affect flat and depressed.     Data Reviewed: I have personally reviewed following labs and imaging studies  CBC:  Recent Labs Lab 09/03/2016 1357 09/07/16 0436 09/08/16 0434 09/09/16 0414 09/10/16 0350 09/11/16 0958  WBC 17.7* 17.2* 23.2* 23.2* 30.2* 27.8*  NEUTROABS 13.9*  --   --   --   --   --   HGB  8.5* 7.7* 6.6* 9.8* 10.7* 10.5*  HCT 26.5* 24.2* 21.0* 29.3* 32.0* 32.0*  MCV 80.3 80.9 80.8 82.3 83.1 85.1  PLT 711* 606* 492* 394 328 778   Basic Metabolic Panel:  Recent Labs Lab 09/07/16 0436 09/08/16 0434 09/09/16 0414 09/10/16 0350 09/11/16 0958  NA 139 137 140 137 138  K 3.7 3.4* 4.0 3.5 3.6  CL 106 108 110 107 107  CO2 20* 21* 21* 19* 20*  GLUCOSE 118* 116* 104* 118* 165*  BUN 39* 20 15 10 14   CREATININE 1.11 0.90 0.80 0.77 0.98  CALCIUM 10.2 9.8 9.8 9.8 9.7   GFR: Estimated Creatinine Clearance: 54.8  mL/min (by C-G formula based on SCr of 0.98 mg/dL). Liver Function Tests:  Recent Labs Lab 08/29/2016 1357 09/08/16 0434  AST 31 34  ALT 22 20  ALKPHOS 102 85  BILITOT 1.0 1.1  PROT 8.5* 6.8  ALBUMIN 3.0* 2.3*   CBG:  Recent Labs Lab 09/07/16 0747 09/08/16 0738 09/09/16 0740 09/11/16 0803  GLUCAP 105* 101* 102* 112*   Sepsis Labs:  Recent Labs Lab 09/10/16 1251 09/10/16 1626 09/11/16 0958 09/11/16 1248  PROCALCITON 0.40  --   --   --   LATICACIDVEN 1.6 2.0* 3.1* 2.4*    Recent Results (from the past 240 hour(s))  Blood Culture (routine x 2)     Status: None   Collection Time: 08/17/2016  1:47 PM  Result Value Ref Range Status   Specimen Description BLOOD LEFT ANTECUBITAL  Final   Special Requests   Final    BOTTLES DRAWN AEROBIC AND ANAEROBIC Blood Culture adequate volume   Culture   Final    NO GROWTH 5 DAYS Performed at [facility], [place] [address] [place], [place] [address]    Report Status 09/11/2016 FINAL  Final  Blood Culture (routine x 2)     Status: None   Collection Time: 08/28/2016  1:57 PM  Result Value Ref Range Status   Specimen Description BLOOD RIGHT ANTECUBITAL  Final   Special Requests IN PEDIATRIC BOTTLE Blood Culture adequate volume  Final   Culture   Final    NO GROWTH 5 DAYS Performed at [facility], [place] [address], [place], [place] [address]    Report Status 09/11/2016 FINAL  Final  Urine culture     Status: Abnormal   Collection Time: 08/26/2016  2:59 PM  Result Value Ref Range Status   Specimen Description URINE, RANDOM  Final   Special Requests NONE  Final   Culture (A)  Final    <10,000 COLONIES/mL INSIGNIFICANT GROWTH Performed at [facility], [place] [address], [place], [place] [address]    Report Status 09/08/2016 FINAL  Final  C difficile quick scan w PCR reflex     Status: None   Collection Time: 09/09/16 11:33 PM  Result Value Ref Range Status   C Diff antigen NEGATIVE NEGATIVE Final   C Diff toxin  NEGATIVE NEGATIVE Final   C Diff interpretation No C. difficile detected.  Final  Culture, blood (routine x 2)     Status: None (Preliminary result)   Collection Time: 09/10/16  1:00 PM  Result Value Ref Range Status   Specimen Description BLOOD RIGHT ARM  Final   Special Requests AEROBIC BOTTLE ONLY Blood Culture adequate volume  Final   Culture   Final    NO GROWTH < 24 HOURS Performed at [facility], [place] [address], [place], [place] [address]  Report Status PENDING  Incomplete  Culture, blood (routine x 2)     Status: None (Preliminary result)   Collection Time: 09/10/16  1:09 PM  Result Value Ref Range Status   Specimen Description BLOOD LEFT ARM  Final   Special Requests AEROBIC BOTTLE ONLY Blood Culture adequate volume  Final   Culture   Final    NO GROWTH < 24 HOURS Performed at [facility], [address], [place], [place] [address]    Report Status PENDING  Incomplete         Radiology Studies: No results found.      Scheduled Meds: . carvedilol  6.25 mg Oral Daily  . isoniazid  300 mg Oral Q supper   And  . vitamin B-6  50 mg Oral Q supper   And  . rifampin  600 mg Oral Q supper   And  . pyrazinamide  1,000 mg Oral Q supper   And  . ethambutol  800 mg Oral Q supper  . feeding supplement (ENSURE ENLIVE)  237 mL Oral BID BM  . ferrous sulfate  325 mg Oral BID WC  . fluconazole  200 mg Oral Daily  . guaiFENesin  600 mg Oral Daily  . mometasone-formoterol  2 puff Inhalation BID  . sodium chloride flush  3 mL Intravenous Q12H   Continuous Infusions: . sodium chloride    . sodium chloride    . cefTRIAXone (ROCEPHIN)  IV Stopped (09/10/16 2141)     LOS: 5 days     Cordelia Poche, MD [facility] 09/11/2016, 1:46 PM Pager: [phone]  If 7PM-7AM, please contact night-coverage [email or web address] Password [email or web address] 09/11/2016, 1:46 PM

## 2016-09-12 ENCOUNTER — Inpatient Hospital Stay (HOSPITAL_COMMUNITY): Payer: Medicare HMO

## 2016-09-12 ENCOUNTER — Emergency Department (HOSPITAL_COMMUNITY)
Admission: EM | Admit: 2016-09-12 | Discharge: 2016-10-11 | Disposition: E | Payer: Medicare HMO | Attending: Emergency Medicine | Admitting: Emergency Medicine

## 2016-09-12 ENCOUNTER — Encounter (HOSPITAL_COMMUNITY): Payer: Self-pay | Admitting: *Deleted

## 2016-09-12 DIAGNOSIS — Z87891 Personal history of nicotine dependence: Secondary | ICD-10-CM | POA: Diagnosis not present

## 2016-09-12 DIAGNOSIS — I10 Essential (primary) hypertension: Secondary | ICD-10-CM | POA: Diagnosis not present

## 2016-09-12 DIAGNOSIS — I469 Cardiac arrest, cause unspecified: Secondary | ICD-10-CM | POA: Diagnosis not present

## 2016-09-12 DIAGNOSIS — Z79899 Other long term (current) drug therapy: Secondary | ICD-10-CM | POA: Insufficient documentation

## 2016-09-12 DIAGNOSIS — D893 Immune reconstitution syndrome: Secondary | ICD-10-CM

## 2016-09-12 DIAGNOSIS — J449 Chronic obstructive pulmonary disease, unspecified: Secondary | ICD-10-CM | POA: Diagnosis not present

## 2016-09-12 DIAGNOSIS — D72829 Elevated white blood cell count, unspecified: Secondary | ICD-10-CM

## 2016-09-12 LAB — DIFFERENTIAL
BAND NEUTROPHILS: 0 %
BASOS PCT: 0 %
BLASTS: 0 %
Basophils Absolute: 0 10*3/uL (ref 0.0–0.1)
EOS ABS: 0 10*3/uL (ref 0.0–0.7)
EOS PCT: 0 %
LYMPHS PCT: 8 %
Lymphs Abs: 2.7 10*3/uL (ref 0.7–4.0)
METAMYELOCYTES PCT: 0 %
MONOS PCT: 5 %
Monocytes Absolute: 1.7 10*3/uL — ABNORMAL HIGH (ref 0.1–1.0)
Myelocytes: 0 %
NEUTROS ABS: 29 10*3/uL — AB (ref 1.7–7.7)
NEUTROS PCT: 87 %
NRBC: 0 /100{WBCs}
OTHER: 0 %
Promyelocytes Absolute: 0 %

## 2016-09-12 LAB — CBC
HEMATOCRIT: 29.3 % — AB (ref 39.0–52.0)
HEMOGLOBIN: 9.9 g/dL — AB (ref 13.0–17.0)
MCH: 28.1 pg (ref 26.0–34.0)
MCHC: 33.8 g/dL (ref 30.0–36.0)
MCV: 83.2 fL (ref 78.0–100.0)
Platelets: 313 10*3/uL (ref 150–400)
RBC: 3.52 MIL/uL — AB (ref 4.22–5.81)
RDW: 16.8 % — ABNORMAL HIGH (ref 11.5–15.5)
WBC: 33.4 10*3/uL — ABNORMAL HIGH (ref 4.0–10.5)

## 2016-09-12 LAB — BASIC METABOLIC PANEL
ANION GAP: 11 (ref 5–15)
BUN: 18 mg/dL (ref 6–20)
CHLORIDE: 107 mmol/L (ref 101–111)
CO2: 21 mmol/L — AB (ref 22–32)
Calcium: 9.5 mg/dL (ref 8.9–10.3)
Creatinine, Ser: 0.8 mg/dL (ref 0.61–1.24)
GFR calc non Af Amer: >60 mL/min (ref >60)
Glucose, Bld: 134 mg/dL — ABNORMAL HIGH (ref 65–99)
POTASSIUM: 3.8 mmol/L (ref 3.5–5.1)
Sodium: 139 mmol/L (ref 135–145)

## 2016-09-12 LAB — TYPE AND SCREEN
ABO/RH(D): O POS
Antibody Screen: NEGATIVE
UNIT DIVISION: 0
UNIT DIVISION: 0
Unit division: 0

## 2016-09-12 LAB — BPAM RBC
BLOOD PRODUCT EXPIRATION DATE: 201805252359
Blood Product Expiration Date: 201805242359
Blood Product Expiration Date: 201805242359
ISSUE DATE / TIME: 201804291135
ISSUE DATE / TIME: 201804291459
Unit Type and Rh: 5100
Unit Type and Rh: 5100
Unit Type and Rh: 5100

## 2016-09-12 LAB — GLUCOSE, CAPILLARY: Glucose-Capillary: 105 mg/dL — ABNORMAL HIGH (ref 65–99)

## 2016-09-12 LAB — PROCALCITONIN: Procalcitonin: 2.5 ng/mL

## 2016-09-12 MED ORDER — CARVEDILOL 12.5 MG PO TABS: 12.5000 mg | ORAL_TABLET | Freq: Two times a day (BID) | ORAL | 0 refills | Status: AC

## 2016-09-12 MED ORDER — FLUCONAZOLE 200 MG PO TABS: 200.0000 mg | ORAL_TABLET | Freq: Every day | ORAL | 0 refills | Status: AC

## 2016-09-12 MED ORDER — ENSURE ENLIVE PO LIQD: 237.0000 mL | Freq: Three times a day (TID) | ORAL | Status: DC

## 2016-09-12 MED ORDER — CARVEDILOL 6.25 MG PO TABS
6.2500 mg | ORAL_TABLET | Freq: Once | ORAL | Status: AC
Start: 2016-09-12 — End: 2016-09-12
  Administered 2016-09-12: 6.25 mg via ORAL
  Filled 2016-09-12: qty 1

## 2016-09-12 MED ORDER — ENSURE ENLIVE PO LIQD: 237.0000 mL | Freq: Two times a day (BID) | ORAL | 0 refills | Status: AC

## 2016-09-12 MED ORDER — GUAIFENESIN ER 600 MG PO TB12: 600.0000 mg | ORAL_TABLET | Freq: Two times a day (BID) | ORAL | 0 refills | Status: AC | PRN

## 2016-09-12 MED ORDER — FERROUS SULFATE 325 (65 FE) MG PO TABS: 325.0000 mg | ORAL_TABLET | Freq: Two times a day (BID) | ORAL | 0 refills | Status: AC

## 2016-09-12 MED ORDER — CARVEDILOL 12.5 MG PO TABS
12.5000 mg | ORAL_TABLET | Freq: Two times a day (BID) | ORAL | Status: DC
  Administered 2016-09-12: 12.5 mg via ORAL
  Filled 2016-09-12: qty 1

## 2016-09-12 NOTE — Progress Notes (Signed)
SATURATION QUALIFICATIONS: (This note is used to comply with regulatory documentation for home oxygen)  Patient Saturations on Room Air at Rest =99% 2L  Patient Saturations on Room Air while Ambulating = 87%  Patient Saturations on 2 Liters of oxygen while Ambulating = 95%  Please briefly explain why patient needs home oxygen: Pt desats room air; visible work of breathing (SOBOE): TB & PNA DX

## 2016-09-12 NOTE — ED Notes (Signed)
[facility] ME called and states that PCP has to sign death certificate.

## 2016-09-12 NOTE — Progress Notes (Addendum)
Per MD, pt with need for follow up CBC draw next week. This CM attempted to [facility] and had to leave a voicemail. This CM spoke with pt at bedside to inform him that he needs to [facility] when he gets home to request RN home visit for lab draw. Pt states he will do this and this CM placed reminder on his AVS. Home 02 ordered and [facility] rep alerted of need for home 02. [place] packet also placed on chart for pt to establish PCP. Pt made aware of [place] resources. Marney Doctor RN,BSN,NCM [phone]

## 2016-09-12 NOTE — Progress Notes (Signed)
For home 02:  Alternate methods tried and were unsuccessful and will require home 02. Marney Doctor RN,BSN,NCM

## 2016-09-12 NOTE — Progress Notes (Signed)
Subjective:   Is having dysphagia still   Antibiotics:  Anti-infectives    Start     Dose/Rate Route Frequency Ordered Stop   09/11/16 1100  fluconazole (DIFLUCAN) tablet 200 mg     200 mg Oral Daily 09/11/16 0959     09/10/16 1700  isoniazid (NYDRAZID) tablet 300 mg     300 mg Oral Daily with supper 09/10/16 0924     09/10/16 1700  rifampin (RIFADIN) capsule 600 mg     600 mg Oral Daily with supper 09/10/16 0924     09/10/16 1700  pyrazinamide tablet 1,000 mg     1,000 mg Oral Daily with supper 09/10/16 0924     09/10/16 1700  ethambutol (MYAMBUTOL) tablet 800 mg     800 mg Oral Daily with supper 09/10/16 0924     09/07/16 2200  levofloxacin (LEVAQUIN) IVPB 750 mg  Status:  Discontinued     750 mg 100 mL/hr over 90 Minutes Intravenous Every 24 hours 09/07/16 1340 09/07/16 1527   09/07/16 2200  cefTRIAXone (ROCEPHIN) 1 g in dextrose 5 % 50 mL IVPB     1 g 100 mL/hr over 30 Minutes Intravenous Every 24 hours 09/07/16 1527     09/07/16 2200  isoniazid (NYDRAZID) tablet 300 mg  Status:  Discontinued     300 mg Oral Daily at bedtime 09/07/16 1527 09/10/16 0924   09/07/16 2200  rifampin (RIFADIN) capsule 600 mg  Status:  Discontinued     600 mg Oral Daily at bedtime 09/07/16 1527 09/10/16 0924   09/07/16 2200  pyrazinamide tablet 1,000 mg  Status:  Discontinued     1,000 mg Oral Daily at bedtime 09/07/16 1527 09/10/16 0924   09/07/16 2200  ethambutol (MYAMBUTOL) tablet 800 mg  Status:  Discontinued     800 mg Oral Daily at bedtime 09/07/16 1527 09/10/16 0924   08/31/2016 2200  levofloxacin (LEVAQUIN) IVPB 750 mg  Status:  Discontinued     750 mg 100 mL/hr over 90 Minutes Intravenous Every 48 hours 08/17/2016 2040 09/07/16 1340      Medications: Scheduled Meds: . carvedilol  12.5 mg Oral BID WC  . carvedilol  6.25 mg Oral Once  . isoniazid  300 mg Oral Q supper   And  . vitamin B-6  50 mg Oral Q supper   And  . rifampin  600 mg Oral Q supper   And  . pyrazinamide   1,000 mg Oral Q supper   And  . ethambutol  800 mg Oral Q supper  . feeding supplement (ENSURE ENLIVE)  237 mL Oral BID BM  . ferrous sulfate  325 mg Oral BID WC  . fluconazole  200 mg Oral Daily  . guaiFENesin  600 mg Oral Daily  . mometasone-formoterol  2 puff Inhalation BID  . sodium chloride flush  3 mL Intravenous Q12H   Continuous Infusions: . sodium chloride    . sodium chloride    . cefTRIAXone (ROCEPHIN)  IV Stopped (09/11/16 2158)   PRN Meds:.acetaminophen **OR** acetaminophen, ondansetron **OR** ondansetron (ZOFRAN) IV    Objective: Weight change: 4 lb 6.5 oz (2 kg)  Intake/Output Summary (Last 24 hours) at 10/07/2016 1159 Last data filed at 09/17/2016 7001  Gross per 24 hour  Intake              290 ml  Output              550 ml  Net             -260 ml   Blood pressure 109/78, pulse (!) 119, temperature 97.9 F (36.6 C), temperature source Oral, resp. rate 18, height _0  (1.753 m), weight 118 lb 2.7 oz (53.6 kg), SpO2 95 %. Temp:  [97 F (36.1 C)-98.9 F (37.2 C)] 97.9 F (36.6 C) (05/03 1022) Pulse Rate:  [102-128] 119 (05/03 1022) Resp:  [18] 18 (05/03 1022) BP: (109-136)/(78-92) 109/78 (05/03 1022) SpO2:  [85 %-99 %] 95 % (05/03 1152) Weight:  [118 lb 2.7 oz (53.6 kg)] 118 lb 2.7 oz (53.6 kg) (05/03 0122)  Physical Exam: General: Alert and awake, oriented x3, not in any acute distress. HEENT: anicteric sclera, pupils reactive to light and accommodation, EOMI, he has thrush in OP  09/11/16:      CVS regular rate, normal r,  no murmur rubs or gallops Chest: diminished breath sounds at the bases Abdomen: soft nontender, nondistended, normal bowel sounds, Extremities: Skin: no rashes Neuro: nonfocal  CBC:  CBC    Component Value Date/Time   WBC 33.4 (H) 10/03/2016 0939   RBC 3.52 (L) 10/06/2016 0939   HGB 9.9 (L) 09/15/2016 0939   HCT 29.3 (L) 09/16/2016 0939   PLT 313 10/06/2016 0939   MCV 83.2 09/30/2016 0939   MCH 28.1 09/28/2016 0939     MCHC 33.8 09/26/2016 0939   RDW 16.8 (H) 09/22/2016 0939   LYMPHSABS 1.1 08/31/2016 1357   MONOABS 2.7 (H) 08/19/2016 1357   EOSABS 0.0 08/18/2016 1357   BASOSABS 0.0 08/27/2016 1357      BMET  Recent Labs  09/11/16 0958 09/27/2016 0939  NA 138 139  K 3.6 3.8  CL 107 107  CO2 20* 21*  GLUCOSE 165* 134*  BUN 14 18  CREATININE 0.98 0.80  CALCIUM 9.7 9.5     Liver Panel  No results for input(s): PROT, ALBUMIN, AST, ALT, ALKPHOS, BILITOT, BILIDIR, IBILI in the last 72 hours.     Sedimentation Rate No results for input(s): ESRSEDRATE in the last 72 hours. C-Reactive Protein No results for input(s): CRP in the last 72 hours.  Micro Results: Recent Results (from the past 720 hour(s))  Respiratory or Resp and Sputum Culture     Status: None   Collection Time: 08/26/16 11:37 AM  Result Value Ref Range Status   Gram Stain Abundant  Final   Gram Stain WBC present-predominately PMN  Final   Gram Stain No Squamous Epithelial Cells Seen  Final   Gram Stain Rare Gram Negative Rods  Final   Gram Stain Rare Gram Positive Cocci In Pairs  Final   Organism ID, Bacteria Normal Oropharyngeal Flora  Final  Fungus Culture & Smear     Status: None (Preliminary result)   Collection Time: 08/26/16 11:37 AM  Result Value Ref Range Status   Source: SPUTUM  Preliminary   Fungus (Mycology) Culture    Preliminary    Comment:   CULTURE, FUNGUS W/SMEAR NOT HAIR, SKIN, BLOOD       MICRO NUMBER:      [id number]   TEST STATUS:       PRELIMINARY   SPECIMEN SOURCE:   SPUTUM   SPECIMEN QUALITY:  ADEQUATE   SMEAR:             No fungal elements seen.   RESULT:            Scant growth of Yeast Isolated.  Please contact the laboratory within 3 days if                      further identification is desired.   AFB Culture & Smear     Status: Abnormal (Preliminary result)   Collection Time: 08/26/16 11:37 AM  Result Value Ref Range Status   Source: SPUTUM  Preliminary    Acid Fast Bacilli (AFB) Culture   (A)  Preliminary    Comment:   MYCOBACTERIA, CULTURE, WITH FLUOROCHROME SMEAR       MICRO NUMBER:      [id number]   TEST STATUS:       PRELIMINARY   SPECIMEN SOURCE:   SPUTUM   SPECIMEN QUALITY:  ADEQUATE   SMEAR:             Many (4 +) acid-fast bacilli seen using the                      fluorochrome method.   RESULT:            Acid-fast bacilli present in liquid culture media.                      Identification to follow.   Culture, bal-quantitative     Status: Abnormal   Collection Time: 08/29/16 10:44 AM  Result Value Ref Range Status   Specimen Description BRONCHIAL ALVEOLAR LAVAGE RIGHT UPPER LUNG  Final   Special Requests Normal  Final   Gram Stain   Final    ABUNDANT WBC PRESENT, PREDOMINANTLY PMN FEW GRAM POSITIVE COCCI IN PAIRS RARE GRAM POSITIVE RODS    Culture (A)  Final    60,000 COLONIES/mL Consistent with normal respiratory flora. Performed at [facility], [place] [address], [place], [place] [address]    Report Status 09/01/2016 FINAL  Final  Pneumocystis smear by DFA     Status: None   Collection Time: 08/29/16 10:44 AM  Result Value Ref Range Status   Specimen Source-PJSRC BRONCHIAL ALVEOLAR LAVAGE  Final    Comment: RT UPPER LOBE   Pneumocystis jiroveci Ag NEGATIVE  Final    Comment: Performed at [facility]  Acid Fast Smear (AFB)     Status: Abnormal   Collection Time: 08/29/16 10:44 AM  Result Value Ref Range Status   AFB Specimen Processing Concentration  Final   Acid Fast Smear Positive (A)  Final    Comment: CRITICAL RESULT CALLED TO, READ BACK BY AND VERIFIED WITH: DR Lake Bells AT 2103 08/30/16 BY N.THOMPSON (NOTE) 4+, more than 36 acid-fast bacilli per field at 400X magnification, fluorescent smear Reported/Faxed results to Wille Glaser. at [place] PM on 4.20.18  KP Faxed to [phone] Performed At: [facility] [place], [place] [id number] Lindon Romp MD [id number]     Source (AFB) BRONCHIAL ALVEOLAR LAVAGE  Final    Comment: RT UPPER LOBE  Acid Fast Culture with reflexed sensitivities     Status: Abnormal   Collection Time: 08/29/16 10:44 AM  Result Value Ref Range Status   Acid Fast Culture Positive (A)  Final    Comment: (NOTE) Acid-fast bacilli have been detected in culture at 2 weeks; see AFB Organism ID by DNA probe Performed At: [facility] [place], [place] [id number] Lindon Romp MD [id number]    Source of Sample BRONCHIAL ALVEOLAR LAVAGE  Final    Comment: RT UPPER  LOBE  Fungus Culture With Stain     Status: None   Collection Time: 08/29/16 10:44 AM  Result Value Ref Range Status   Fungus Stain Final report  Final   Fungus (Mycology) Culture Preliminary report  Final    Comment: (NOTE) Performed At: [facility] [place], [place] [id number] Lindon Romp MD WU:[id number]    Fungal Source BRONCHIAL ALVEOLAR LAVAGE  Final    Comment: RT UPPER LOBE  Fungus Culture Result     Status: None   Collection Time: 08/29/16 10:44 AM  Result Value Ref Range Status   Result 1 Comment  Final    Comment: (NOTE) KOH/Calcofluor preparation:  no fungus observed. Performed At: [facility] [place], [place] [id number] Lindon Romp MD VH:[id number]   Fungal organism reflex     Status: None   Collection Time: 08/29/16 10:44 AM  Result Value Ref Range Status   Fungal result 1 Candida albicans  Final    Comment: (NOTE) 1-2 colonies                                            . Performed At: [facility] [place], [place] [id number] Lindon Romp MD [id number]   AFB Organism ID By DNA Probe     Status: Abnormal   Collection Time: 08/29/16 10:44 AM  Result Value Ref Range Status   M tuberculosis complex Positive (A)  Final    Comment: (NOTE) Reported to [facility] 09/09/16 @ 50 AE Faxed to [phone]    M avium complex Negative  Final     Jerilynn Mages [place]ii Not Indicated  Final   M gordonae Not Indicated  Final   Other: NAIDN  Final    Comment: No additional identification testing is necessary.   Susceptibility Testing See reflex.  Final    Comment: (NOTE) Performed At: [facility] [place], [place] [id number] Lindon Romp MD [id number]   Culture, bal-quantitative     Status: Abnormal   Collection Time: 08/29/16 10:49 AM  Result Value Ref Range Status   Specimen Description BRONCHIAL ALVEOLAR LAVAGE RT MIDDLE LOBE  Final   Special Requests Normal  Final   Gram Stain NO WBC SEEN NO ORGANISMS SEEN   Final   Culture (A)  Final    10,000 COLONIES/mL Consistent with normal respiratory flora. Performed at [facility], [place] [address], [place], [place] [address]    Report Status 09/01/2016 FINAL  Final  Acid Fast Smear (AFB)     Status: Abnormal   Collection Time: 08/29/16 10:49 AM  Result Value Ref Range Status   AFB Specimen Processing Concentration  Final   Acid Fast Smear Positive (A)  Final    Comment: CRITICAL RESULT CALLED TO, READ BACK BY AND VERIFIED WITH: DR Lake Bells AT 2103 08/30/16 BY N.THOMPSON (NOTE) 3+, 4-36 acid-fast bacilli per field at 400X magnification, fluorescent smear POSITIVE SMEAR REPORTED TO SHANNON P. AT 1125 ON 08/30/16 BY AM. Performed At: [facility] [place], [place] [id number] Lindon Romp MD [id number]    Source (AFB) BRONCHIAL ALVEOLAR LAVAGE  Final    Comment: RT MIDDLE LOBE  Fungus Culture With Stain     Status: None   Collection Time: 08/29/16 10:49 AM  Result Value Ref Range Status   Fungus Stain  Final report  Final   Fungus (Mycology) Culture Preliminary report  Final    Comment: (NOTE) Performed At: [facility] [place], [place] [id number] Lindon Romp MD WG:[id number]    Fungal Source BRONCHIAL ALVEOLAR LAVAGE  Final    Comment: RT MIDDLE LOBE  Fungus Culture Result     Status:  None   Collection Time: 08/29/16 10:49 AM  Result Value Ref Range Status   Result 1 Comment  Final    Comment: (NOTE) KOH/Calcofluor preparation:  no fungus observed. Performed At: [facility] [place], [place] [id number] Lindon Romp MD [id number]   Fungal organism reflex     Status: None   Collection Time: 08/29/16 10:49 AM  Result Value Ref Range Status   Fungal result 1 Candida albicans  Final    Comment: (NOTE) 1-2 colonies                                            . Performed At: [facility] [place], [place] [id number] Lindon Romp MD [id number]   Blood Culture (routine x 2)     Status: None   Collection Time: 08/15/2016  1:47 PM  Result Value Ref Range Status   Specimen Description BLOOD LEFT ANTECUBITAL  Final   Special Requests   Final    BOTTLES DRAWN AEROBIC AND ANAEROBIC Blood Culture adequate volume   Culture   Final    NO GROWTH 5 DAYS Performed at [facility], [address], [place], [place] [address]    Report Status 09/11/2016 FINAL  Final  Blood Culture (routine x 2)     Status: None   Collection Time: 08/11/2016  1:57 PM  Result Value Ref Range Status   Specimen Description BLOOD RIGHT ANTECUBITAL  Final   Special Requests IN PEDIATRIC BOTTLE Blood Culture adequate volume  Final   Culture   Final    NO GROWTH 5 DAYS Performed at [facility], [place] [address], [place], [place] [address]    Report Status 09/11/2016 FINAL  Final  Urine culture     Status: Abnormal   Collection Time: 08/24/2016  2:59 PM  Result Value Ref Range Status   Specimen Description URINE, RANDOM  Final   Special Requests NONE  Final   Culture (A)  Final    <10,000 COLONIES/mL INSIGNIFICANT GROWTH Performed at [facility], [place] [address] [place], [place] [address]    Report Status 09/08/2016 FINAL  Final  C difficile quick scan w PCR reflex     Status: None   Collection Time: 09/09/16 11:33 PM   Result Value Ref Range Status   C Diff antigen NEGATIVE NEGATIVE Final   C Diff toxin NEGATIVE NEGATIVE Final   C Diff interpretation No C. difficile detected.  Final  Culture, blood (routine x 2)     Status: None (Preliminary result)   Collection Time: 09/10/16  1:00 PM  Result Value Ref Range Status   Specimen Description BLOOD RIGHT ARM  Final   Special Requests AEROBIC BOTTLE ONLY Blood Culture adequate volume  Final   Culture   Final    NO GROWTH 2 DAYS Performed at [facility], [address], [place], [place] [address]    Report Status PENDING  Incomplete  Culture, blood (routine x 2)  Status: None (Preliminary result)   Collection Time: 09/10/16  1:09 PM  Result Value Ref Range Status   Specimen Description BLOOD LEFT ARM  Final   Special Requests AEROBIC BOTTLE ONLY Blood Culture adequate volume  Final   Culture   Final    NO GROWTH 2 DAYS Performed at [facility], [address] [place], [place] [address]    Report Status PENDING  Incomplete    Studies/Results: Dg Chest 2 View  Result Date: 09/11/2016 CLINICAL DATA:  Shortness of breath, known tuberculosis. EXAM: CHEST  2 VIEW COMPARISON:  09/05/2016, 07/11/2016 and CT chest 07/15/2016. FINDINGS: Trachea is midline. Heart size normal. Large relatively thin-walled cavity is again seen in the upper right hemithorax with diffuse micro nodularity in the lungs bilaterally, increased on the left. No definite pleural fluid. 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 Electronically Signed   By: Lorin Picket M.D.   On: 09/11/2016 16:01      Assessment/Plan:  INTERVAL HISTORY: MTB PCR IS POSITIVE FROM BRONCHOSCOPY   Active Problems:   Hypertension   AKI (acute kidney injury) ([place])   Dehydration   Pulmonary TB   Bacterial pneumonia   Routine screening for STI (sexually transmitted infection)   IRIS  (immune reconstitution inflammatory syndrome) ([place])   Thrush    Lawrence Sanford is a 66 y.o. male with  AFB smear positive TB, confirmed by PCR (see EPIC labs from bronchoscopy) who was admitted with fatigue, weakness, fever here and leukocytosis being treated for superimposed PNA  #1 Pulmonary TB: CXR showing nodules that have inceased Would not change course of therapy at this point. I think some of this is likely a component of IRIS  Continue RIPE  Continue airborne   #2 Fevers, malaise, leukocytosis:  Resolving. Willl DC anti bacterial abx  HIV negative  #3 Thrush: will give fluconazole. Treat for 14 days with azole    LOS: 6 days    I will sign off for now.  Please call back with further questions.   Rhina Brackett [place] 09/25/2016, 11:59 AM

## 2016-09-12 NOTE — Evaluation (Signed)
Occupational Therapy Evaluation Patient Details Name: Lawrence Sanford MRN: [id number] DOB: 02-09-1951 Today's Date: 09/20/2016    History of Present Illness 66 y.o. 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 He presented to ED with worsening weakness and fatigue as well as poor po intake for past few days. Dx of dehydration, AKI, R upper lobe lesion.   Clinical Impression   Pt was admitted for the above.  He reports that he is independent with adls at baseline.  Pt currently needs set up/supervision for ADLs.  Did not assess bathroom transfers at this time.  HR increased at this time.  Will follow in acute setting with supervision to mod I goals    Follow Up Recommendations  Supervision/Assistance - 24 hour    Equipment Recommendations   (likely none)    Recommendations for Other Services       Precautions / Restrictions Precautions Precaution Comments: monitor vitals Restrictions Weight Bearing Restrictions: No      Mobility Bed Mobility Overal bed mobility: Modified Independent             General bed mobility comments: increased time   Transfers Overall transfer level: Needs assistance     Sit to Stand: Supervision         General transfer comment: no device    Balance     Sitting balance-Leahy Scale: Good       Standing balance-Leahy Scale: Fair                             ADL either performed or assessed with clinical judgement   ADL Overall ADL's : Needs assistance/impaired     Grooming: Set up;Sitting   Upper Body Bathing: Set up;Sitting   Lower Body Bathing: Supervison/ safety;Set up;Sit to/from stand   Upper Body Dressing : Set up;Sitting   Lower Body Dressing: Supervision/safety;Set up;Sit to/from stand                 General ADL Comments: Pt sat EOB and walked around bed.  He states that he gets lightheaded at  times with position changes and initially felt dizzy.  This resolved and he did not c/o further dizziness. HR 120. Educated on energy conservation:  pt states he can borrow a shower seat from his brother.  Will bring handout on next visit.       Vision         Perception     Praxis      Pertinent Vitals/Pain Pain Assessment: No/denies pain     Hand Dominance     Extremity/Trunk Assessment Upper Extremity Assessment Upper Extremity Assessment: Overall WFL for tasks assessed           Communication Communication Communication: No difficulties   Cognition Arousal/Alertness: Awake/alert Behavior During Therapy: WFL for tasks assessed/performed Overall Cognitive Status: Within Functional Limits for tasks assessed                                     General Comments       Exercises     Shoulder Instructions      Home Living Family/patient expects to be discharged to:: Private residence Living Arrangements: Spouse/significant other Available Help at Discharge: Family  Bathroom Shower/Tub: Occupational psychologist: Standard         Additional Comments: states that he can borrow a shower seat from his brother, who is disabled, and has several      Prior Functioning/Environment Level of Independence: Independent                 OT Problem List: Decreased strength;Decreased activity tolerance;Pain;Decreased knowledge of use of DME or AE      OT Treatment/Interventions: Self-care/ADL training;DME and/or AE instruction;Patient/family education;Balance training;Therapeutic activities    OT Goals(Current goals can be found in the care plan section) Acute Rehab OT Goals Patient Stated Goal: to walk farther without SOB OT Goal Formulation: With patient Time For Goal Achievement: 09/19/16 Potential to Achieve Goals: Good ADL Goals Pt Will Transfer to Toilet: with modified independence;regular height  toilet;ambulating Pt Will Perform Tub/Shower Transfer: Shower transfer;with supervision;ambulating;shower seat Additional ADL Goal #1: pt will gather clothes at supervision level Additional ADL Goal #2: pt will verbalize 3 energy conservation strategies.  OT Frequency: Min 2X/week   Barriers to D/C:            Co-evaluation              AM-PAC PT "6 Clicks" Daily Activity     Outcome Measure Help from another person eating meals?: None Help from another person taking care of personal grooming?: A Little Help from another person toileting, which includes using toliet, bedpan, or urinal?: A Little Help from another person bathing (including washing, rinsing, drying)?: A Little Help from another person to put on and taking off regular upper body clothing?: A Little Help from another person to put on and taking off regular lower body clothing?: A Little 6 Click Score: 19   End of Session    Activity Tolerance: Patient tolerated treatment well Patient left: in bed;with call bell/phone within reach  OT Visit Diagnosis: Muscle weakness (generalized) (M62.81)                Time: 3254-9826 OT Time Calculation (min): 12 min Charges:  OT General Charges $OT Visit: 1 Procedure OT Evaluation $OT Eval Low Complexity: 1 Procedure G-Codes:     [place], OTR/L [phone] 09/16/2016  Lawrence Sanford 09/24/2016, 2:31 PM

## 2016-09-12 NOTE — ED Notes (Signed)
Paged Chaplain for family

## 2016-09-12 NOTE — Discharge Summary (Signed)
Physician Discharge Summary  Lawrence Sanford VQX:[id number] DOB: Jun 03, 1950 DOA: 08/26/2016  PCP: Pcp Not In System  Admit date: 08/21/2016 Discharge date: 09/17/2016  Admitted From: Home Disposition: Home  Recommendations for Outpatient Follow-up:  1. Follow up with PCP in 1 week 2. Please obtain CBC early next week to follow-up leukocytosis 3. Please follow up on the following pending results: Blood cultures  Home Health: PT, OT Equipment/Devices: Oxygen, Rolling walker  Discharge Condition: Stable CODE STATUS: Full code Diet recommendation: Dysphagia 3   Brief/Interim Summary:  Admission HPI written by Robbie Lis, MD   Chief Complaint: poor po intake and weakness   HPI: Lawrence Sanford is a 66 y.o. male with medical history significant for TB (has a health dept person who comes in to administer medications), history of hypertension. He presented to ED with worsening weakness and fatigue as well as poor po intake for past few days ago. Patient reported feeling more tired. No reports of chest pain but he does have a cough intermittently productive of yellowish sputum. No fevers. No chills. No reports of weight loss.  ED Course: In ED, BP was 126/81, HR 87-101, RR 18-29, oxygen saturation 80% which has improved to 95% with [place] oxygen support. Blood work showed WBC count of 17.7, hemoglobin of 8.5, platelets 711, creatinine 1.68, calcium 10.8, lactic acid WNL. CXR showed bilateral opacities. Will start empiric levaquin for possible superimposed pneumonia.    Hospital course:  Active pulmonary TB Cavitary lesion seen on Previous CT scan. Patient currently follows with health department and started on RIPE (rifampin, isoniazid, as an aside, ethambutol) therapy. HIV and hepatitis panels negative. Infectious disease followed while inpatient.  Acute kidney injury Dehydration Lactic acid elevated with initial creatinine of 1.68. Baseline of 0.86 per labs in 2013. Resolved with IV  hydration.  Generalized weakness Patient is very deconditioned and appears malnurished. PT and OT were ordered for him at home. Dietitian consult placed and recommended protein supplement.  Community acquired pneumonia Bilateral lung infiltrates. On chest x-ray. Patient was treated with Levaquin for 1 day, followed by ceftriaxone for an additional 5 days. Patient was afebrile.  Leukocytosis Initially thought secondary to pneumonia. Continue to worsen. Patient afebrile and blood cultures negative to date. Discussed with infectious disease, who feels this is secondary to currently active pulmonary TB. Repeat chest CT was obtained to ensure no empyema, which was significant for confirmation of evolved TB and left lower lobe infiltrate as seen on previous chest x-rays.  Diarrhea Present on admission and resolved. C. difficile was negative.  Thrombocytosis Likely secondary to dehydration and resolved.  Essential hypertension Initially continued carvedilol and increased to 12.5 mg twice a day  Hypokalemia Resolved with supplementation  Hypercalcemia Resolved with IV fluids  Anemia of chronic disease s/p 2 units PRBC. Stable.  Oral thrush Started on fluconazole.  Acute respiratory failure Secondary to acute pulmonary TB. Started on oxygen therapy and maintained adequate O2 saturations.  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   carvedilol 12.5 MG tablet Commonly known  as:  COREG Take 1 tablet (12.5 mg total) by mouth 2 (two) times daily with a meal. What changed:  medication strength  how much to take  when to take this   D3-50 50000 units capsule Generic drug:  Cholecalciferol Take 50,000 capsules by mouth daily. Tuesday   ethambutol 400 MG tablet Commonly known as:  MYAMBUTOL Take 800 mg by mouth daily.   feeding supplement (ENSURE ENLIVE) Liqd Take 237 mLs by mouth 2 (two) times daily between meals.   ferrous sulfate 325 (65 FE) MG tablet Take 1 tablet (325 mg total) by mouth 2 (two) times daily with a meal.   fluconazole 200 MG tablet Commonly known as:  DIFLUCAN Take 1 tablet (200 mg total) by mouth daily. Start taking on:  Sep 28, 2016   guaiFENesin 600 MG 12 hr tablet Commonly known as:  MUCINEX Take 1 tablet (600 mg total) by mouth 2 (two) times daily as needed. What changed:  when to take this  reasons to take this   isoniazid 300 MG tablet Commonly known as:  NYDRAZID Take 300 mg by mouth daily.   losartan-hydrochlorothiazide 50-12.5 MG tablet Commonly known as:  HYZAAR Take 1 tablet by mouth daily.   pyrazinamide 500 MG tablet Take 1,000 mg by mouth daily.   pyridOXINE 25 MG tablet Commonly known as:  VITAMIN B-6 Take 25 mg by mouth daily.   rifampin 300 MG capsule Commonly known as:  RIFADIN Take 600 mg by mouth daily.            Durable Medical Equipment        Start     Ordered   09/10/2016 1246  For home use only DME oxygen  Once    Question Answer Comment  Mode or (Route) Nasal cannula   Liters per Minute 2   Frequency Continuous (stationary and portable oxygen unit needed)   Oxygen delivery system Gas      09/16/2016 1246   09/10/16 1344  For home use only DME Walker rolling  Once    Question:  Patient needs a walker to treat with the following condition  Answer:  Weakness   09/10/16 1343      No Known Allergies  Consultations:  Infectious disease   Procedures/Studies: Dg Chest 2  View  Result Date: 09/11/2016 CLINICAL DATA:  Shortness of breath, known tuberculosis. EXAM: CHEST  2 VIEW COMPARISON:  08/15/2016, 07/11/2016 and CT chest 07/15/2016. FINDINGS: Trachea is midline. Heart size normal. Large relatively thin-walled cavity is again seen in the upper right hemithorax with diffuse micro nodularity in the lungs bilaterally, increased on the left. No definite pleural fluid. 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 Electronically Signed   By: Lorin Picket M.D.   On: 09/11/2016 16:01   Dg Chest 2 View  Result Date: 08/26/2016 CLINICAL DATA:  Cough and congestion for several days EXAM: CHEST  2 VIEW COMPARISON:  07/15/2016, 07/11/2016 FINDINGS: Cardiac shadow is within normal limits. Diffuse inflammatory changes are noted throughout the right lung similar to that seen on prior examination with evidence of a large cavitary lesion predominately within the right upper lobe. No sizable effusion is seen. No new focal abnormality is noted. No bony abnormality is seen. IMPRESSION: Stable appearing inflammatory changes and cavitary lesion in the right lung. Electronically Signed   By: Linus Mako.D.  On: 08/26/2016 13:28   Ct Chest Wo Contrast  Result Date: 09/24/2016 CLINICAL DATA:  Pulmonary tuberculosis. EXAM: CT CHEST WITHOUT CONTRAST TECHNIQUE: Multidetector CT imaging of the chest was performed following the standard protocol without IV contrast. COMPARISON:  07/15/2016 chest CT.  Chest x-ray from yesterday FINDINGS: Cardiovascular: Normal heart size. No pericardial effusion. Aortic and coronary atherosclerosis. Mediastinum/Nodes: Mildly enlarged mediastinal lymph nodes, including 16 mm subcarinal node. Patient is reportedly HIV negative. Lungs/Pleura: Large irregularly-shaped and moderately thick walled cavity centered in the right upper lobe, traversing the major fissure. The  cavity measures up to 10 cm in diameter and has mildly increased in size and involvement of the apical lung. Small levels are present. Diffuse tree in bud nodularity with superimposed consolidative areas in the right middle and right lower lobes. There is bronchiectasis and diffuse airway plugging in the right lower lobe. Ground-glass opacity throughout the left lower lobe which is new and different pattern than the airspace disease elsewhere. No effusion. Centrilobular emphysema. Upper Abdomen: Negative Musculoskeletal: Diffuse disc degeneration, which likely accounts for advanced narrowing and endplate irregularity at T8-9. No paravertebral inflammation in this region to suggest an osseous infection. IMPRESSION: 1. Active tuberculosis with ~10 cm thick walled cavity in the right upper lobe crossing through the major fissure. Cavity has mildly enlarged since 07/15/2016. 2. Diffuse tree in bud and consolidative opacities compatible with endobronchial spread. There has been significant progression in the left lower lobe. Diffuse ground-glass opacity in the left lower lobe which has a different pattern than disease elsewhere, question superimposed pneumonia or alveolar hemorrhage. Electronically Signed   By: Monte Fantasia M.D.   On: 09/18/2016 15:30   Dg Chest Port 1 View  Result Date: 08/12/2016 CLINICAL DATA:  Shortness of breath, chest pain. EXAM: PORTABLE CHEST 1 VIEW COMPARISON:  Radiograph of August 29, 2016. FINDINGS: The heart size and mediastinal contours are within normal limits. Stable left basilar scarring is noted. Stable diffuse reticulonodular densities noted in right lung consistent with inflammation. Stable cavitary abnormality seen in right upper lobe. No significant changes compared to prior exam. The visualized skeletal structures are unremarkable. IMPRESSION: Stable bilateral pulmonary opacities, right greater than left, compared to prior exam. Electronically Signed   By: Marijo Conception,  M.D.   On: 08/23/2016 14:26   Dg Chest Port 1 View  Result Date: 08/29/2016 CLINICAL DATA:  Status post bronchoscopy. EXAM: PORTABLE CHEST 1 VIEW COMPARISON:  08/26/2016 FINDINGS: There is bilateral nodular airspace disease in the right upper lobe, right middle lobe and right lower lobe. Mild nodular airspace disease in the left lung to lesser extent. Large cavitary mass in the right upper lobe measuring 6.2 x 5.4 cm. There is no pleural effusion or pneumothorax. The heart and mediastinal contours are unremarkable. The osseous structures are unremarkable. IMPRESSION: 1. Persistent bilateral nodular airspace disease, right worse than left with a cavitary left upper lobe mass. Correlate with bronchial washings. Differential considerations include atypical infection such as tuberculosis or fungal infection. Electronically Signed   By: Kathreen Devoid   On: 08/29/2016 11:32       Subjective: Patient reports that he has no dyspnea or chest pain today. He is asking to go home today. He feels better on the oxygen.  Discharge Exam: Vitals:   10/06/2016 1022 09/29/2016 1342  BP: 109/78 110/72  Pulse: (!) 119 96  Resp: 18 18  Temp: 97.9 F (36.6 C) 98 F (36.7 C)   Vitals:   09/26/2016 1017 10/02/2016 1022  09/10/2016 1152 09/19/2016 1342  BP: 111/78 109/78  110/72  Pulse: (!) 108 (!) 119  96  Resp: 18 18  18   Temp: 97.9 F (36.6 C) 97.9 F (36.6 C)  98 F (36.7 C)  TempSrc: Oral Oral  Oral  SpO2: 99% (!) 88% 95% 95%  Weight:      Height:        General exam: Appears calm and comfortable. Very thin Respiratory system: Clear to auscultation. Decreased breath sounds on right middle/lower fields. Respiratory effort normal. Cardiovascular system: S1 & S2 heard, RRR. No murmurs, rubs, gallops or clicks. Gastrointestinal system: Abdomen is nondistended, soft and nontender. Normal bowel sounds heard. Central nervous system: Alert and oriented. No focal neurological deficits. Extremities: No edema. No calf  tenderness Skin: No cyanosis. No rashes Psychiatry: Judgement and insight appear normal. Mood & affect flat and depressed.    The results of significant diagnostics from this hospitalization (including imaging, microbiology, ancillary and laboratory) are listed below for reference.     Microbiology: Recent Results (from the past 240 hour(s))  Blood Culture (routine x 2)     Status: None   Collection Time: 09/02/2016  1:47 PM  Result Value Ref Range Status   Specimen Description BLOOD LEFT ANTECUBITAL  Final   Special Requests   Final    BOTTLES DRAWN AEROBIC AND ANAEROBIC Blood Culture adequate volume   Culture   Final    NO GROWTH 5 DAYS Performed at [facility], [address], [place], [place] [address]    Report Status 09/11/2016 FINAL  Final  Blood Culture (routine x 2)     Status: None   Collection Time: 08/31/2016  1:57 PM  Result Value Ref Range Status   Specimen Description BLOOD RIGHT ANTECUBITAL  Final   Special Requests IN PEDIATRIC BOTTLE Blood Culture adequate volume  Final   Culture   Final    NO GROWTH 5 DAYS Performed at [facility], [place] [address] [place], [place] [address]    Report Status 09/11/2016 FINAL  Final  Urine culture     Status: Abnormal   Collection Time: 08/29/2016  2:59 PM  Result Value Ref Range Status   Specimen Description URINE, RANDOM  Final   Special Requests NONE  Final   Culture (A)  Final    <10,000 COLONIES/mL INSIGNIFICANT GROWTH Performed at [facility], [place] [address] [place], [place] [address]    Report Status 09/08/2016 FINAL  Final  C difficile quick scan w PCR reflex     Status: None   Collection Time: 09/09/16 11:33 PM  Result Value Ref Range Status   C Diff antigen NEGATIVE NEGATIVE Final   C Diff toxin NEGATIVE NEGATIVE Final   C Diff interpretation No C. difficile detected.  Final  Culture, blood (routine x 2)     Status: None (Preliminary result)   Collection Time: 09/10/16  1:00 PM  Result Value  Ref Range Status   Specimen Description BLOOD RIGHT ARM  Final   Special Requests AEROBIC BOTTLE ONLY Blood Culture adequate volume  Final   Culture   Final    NO GROWTH 2 DAYS Performed at [facility], [address], [place], [place] [address]    Report Status PENDING  Incomplete  Culture, blood (routine x 2)     Status: None (Preliminary result)   Collection Time: 09/10/16  1:09 PM  Result Value Ref Range Status   Specimen Description BLOOD  LEFT ARM  Final   Special Requests AEROBIC BOTTLE ONLY Blood Culture adequate volume  Final   Culture   Final    NO GROWTH 2 DAYS Performed at [facility], [address] [place], [place] [address]    Report Status PENDING  Incomplete     Labs: BNP (last 3 results) No results for input(s): BNP in the last 8760 hours. Basic Metabolic Panel:  Recent Labs Lab 09/08/16 0434 09/09/16 0414 09/10/16 0350 09/11/16 0958 10/03/2016 0939  NA 137 140 137 138 139  K 3.4* 4.0 3.5 3.6 3.8  CL 108 110 107 107 107  CO2 21* 21* 19* 20* 21*  GLUCOSE 116* 104* 118* 165* 134*  BUN 20 15 10 14 18   CREATININE 0.90 0.80 0.77 0.98 0.80  CALCIUM 9.8 9.8 9.8 9.7 9.5   Liver Function Tests:  Recent Labs Lab 08/12/2016 1357 09/08/16 0434  AST 31 34  ALT 22 20  ALKPHOS 102 85  BILITOT 1.0 1.1  PROT 8.5* 6.8  ALBUMIN 3.0* 2.3*   No results for input(s): LIPASE, AMYLASE in the last 168 hours. No results for input(s): AMMONIA in the last 168 hours. CBC:  Recent Labs Lab 08/29/2016 1357  09/08/16 0434 09/09/16 0414 09/10/16 0350 09/11/16 0958 09/21/2016 0939  WBC 17.7*  < > 23.2* 23.2* 30.2* 27.8* 33.4*  NEUTROABS 13.9*  --   --   --   --   --  29.0*  HGB 8.5*  < > 6.6* 9.8* 10.7* 10.5* 9.9*  HCT 26.5*  < > 21.0* 29.3* 32.0* 32.0* 29.3*  MCV 80.3  < > 80.8 82.3 83.1 85.1 83.2  PLT 711*  < > 492* 394 328 330 313  < > = values in this interval not displayed. Cardiac Enzymes: No results for input(s): CKTOTAL, CKMB, CKMBINDEX,  TROPONINI in the last 168 hours. BNP: Invalid input(s): POCBNP CBG:  Recent Labs Lab 09/07/16 0747 09/08/16 0738 09/09/16 0740 09/11/16 0803 09/18/2016 0807  GLUCAP 105* 101* 102* 112* 105*   D-Dimer No results for input(s): DDIMER in the last 72 hours. Hgb A1c No results for input(s): HGBA1C in the last 72 hours. Lipid Profile No results for input(s): CHOL, HDL, LDLCALC, TRIG, CHOLHDL, LDLDIRECT in the last 72 hours. Thyroid function studies No results for input(s): TSH, T4TOTAL, T3FREE, THYROIDAB in the last 72 hours.  Invalid input(s): FREET3 Anemia work up No results for input(s): VITAMINB12, FOLATE, FERRITIN, TIBC, IRON, RETICCTPCT in the last 72 hours. Urinalysis    Component Value Date/Time   COLORURINE YELLOW 08/20/2016 1459   APPEARANCEUR CLEAR 08/22/2016 1459   LABSPEC 1.014 09/03/2016 1459   PHURINE 5.0 08/30/2016 1459   GLUCOSEU 50 (A) 08/29/2016 1459   HGBUR SMALL (A) 08/16/2016 1459   BILIRUBINUR NEGATIVE 08/11/2016 1459   KETONESUR 5 (A) 08/24/2016 1459   PROTEINUR 30 (A) 08/20/2016 1459   NITRITE NEGATIVE 09/04/2016 1459   LEUKOCYTESUR NEGATIVE 08/18/2016 1459   Sepsis Labs Invalid input(s): PROCALCITONIN,  WBC,  LACTICIDVEN Microbiology Recent Results (from the past 240 hour(s))  Blood Culture (routine x 2)     Status: None   Collection Time: 08/16/2016  1:47 PM  Result Value Ref Range Status   Specimen Description BLOOD LEFT ANTECUBITAL  Final   Special Requests   Final    BOTTLES DRAWN AEROBIC AND ANAEROBIC Blood Culture adequate volume   Culture   Final    NO GROWTH 5 DAYS Performed at [facility], [place] [address]  [place], [place] [address]    Report Status 09/11/2016 FINAL  Final  Blood Culture (routine x 2)     Status: None   Collection Time: 09/04/2016  1:57 PM  Result Value Ref Range Status   Specimen Description BLOOD RIGHT ANTECUBITAL  Final   Special Requests IN PEDIATRIC BOTTLE Blood Culture adequate volume  Final   Culture    Final    NO GROWTH 5 DAYS Performed at [facility], [place] [address] [place], [place] [address]    Report Status 09/11/2016 FINAL  Final  Urine culture     Status: Abnormal   Collection Time: 08/31/2016  2:59 PM  Result Value Ref Range Status   Specimen Description URINE, RANDOM  Final   Special Requests NONE  Final   Culture (A)  Final    <10,000 COLONIES/mL INSIGNIFICANT GROWTH Performed at [facility], [place] [address], [place], [place] [address]    Report Status 09/08/2016 FINAL  Final  C difficile quick scan w PCR reflex     Status: None   Collection Time: 09/09/16 11:33 PM  Result Value Ref Range Status   C Diff antigen NEGATIVE NEGATIVE Final   C Diff toxin NEGATIVE NEGATIVE Final   C Diff interpretation No C. difficile detected.  Final  Culture, blood (routine x 2)     Status: None (Preliminary result)   Collection Time: 09/10/16  1:00 PM  Result Value Ref Range Status   Specimen Description BLOOD RIGHT ARM  Final   Special Requests AEROBIC BOTTLE ONLY Blood Culture adequate volume  Final   Culture   Final    NO GROWTH 2 DAYS Performed at [facility], [address], [place], [place] [address]    Report Status PENDING  Incomplete  Culture, blood (routine x 2)     Status: None (Preliminary result)   Collection Time: 09/10/16  1:09 PM  Result Value Ref Range Status   Specimen Description BLOOD LEFT ARM  Final   Special Requests AEROBIC BOTTLE ONLY Blood Culture adequate volume  Final   Culture   Final    NO GROWTH 2 DAYS Performed at [facility], [place] [address] [place], [place] [address]    Report Status PENDING  Incomplete     Time coordinating discharge: Over 30 minutes  SIGNED:   Cordelia Poche, MD [facility] 09/19/2016, 3:54 PM Pager [phone]  If 7PM-7AM, please contact night-coverage [email or web address] Password [email or web address]

## 2016-09-12 NOTE — Progress Notes (Signed)
Initial Nutrition Assessment  DOCUMENTATION CODES:   Severe malnutrition in context of acute illness/injury, Underweight  INTERVENTION:   -Increase Ensure Enlive to TID, each supplement provides 350 kcal and 20 grams of protein -Given thrush and subsequent dysphagia, will order mechanical soft diet.  Encourage PO intake RD to continue to monitor  NUTRITION DIAGNOSIS:   Malnutrition related to acute illness, dysphagia as evidenced by percent weight loss, energy intake < or equal to 50% for > or equal to 5 days.  GOAL:   Patient will meet greater than or equal to 90% of their needs  MONITOR:   PO intake, Supplement acceptance, Labs, Weight trends, I & O's  REASON FOR ASSESSMENT:   Consult Assessment of nutrition requirement/status  ASSESSMENT:   66 y.o. male with a history of TB, hypertension. Patient presented with cough with concern for pneumonia and found to have an acute kidney injury.  Patient with dysphagia and thrush symptoms. Pt has not been eating well during this admission, PO intakes have ranged from 25-30% of meals x 6 days now, each providing minimal nutrition ~200 kcal and 6-9g protein. Pt is consuming mainly liquids. Will order patient dysphagia 3 diet (mechanical soft) to help with current dysphagia issues. Pt is receiving Ensure supplements and accepting them.  Per chart review, pt has lost 10 lb since 3/1 (8% wt loss x 2 months, significant for time frame).   Medications: Vitamin B-6 tablet daily, Ferrous Sulfate BID  Labs reviewed: CBGs: 105  Diet Order:  Diet regular Room service appropriate? Yes; Fluid consistency: Thin  Skin:  Reviewed, no issues  Last BM:  5/3  Height:   Ht Readings from Last 1 Encounters:  08/21/2016 5\' 9"  (1.753 m)    Weight:   Wt Readings from Last 1 Encounters:  09/14/2016 118 lb 2.7 oz (53.6 kg)    Ideal Body Weight:  72.7 kg  BMI:  Body mass index is 17.45 kg/m.  Estimated Nutritional Needs:   Kcal:   1600-1800  Protein:  80-90g  Fluid:  1.8L/day  EDUCATION NEEDS:   No education needs identified at this time  Clayton Bibles, MS, RD, LDN Pager: [phone] After Hours Pager: [phone]

## 2016-09-12 NOTE — ED Notes (Signed)
Dr. Cathleen Fears has spoke with the family, chaplin offered for support.

## 2016-09-12 NOTE — Progress Notes (Signed)
Patient ambulated in halls on room air. O2 sats dropped to 88%

## 2016-09-12 NOTE — Progress Notes (Signed)
Physical Therapy Treatment Patient Details Name: Lawrence Sanford MRN: [id number] DOB: Dec 30, 1950 Today's Date: 10/08/2016    History of Present Illness 66 y.o. 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 He presented to ED with worsening weakness and fatigue as well as poor po intake for past few days. Dx of dehydration, AKI, R upper lobe lesion.    PT Comments    Pt in bed on 2.5 lts nasal with O2 95%.  Pt c/o dizziness.  Supine BP 86/63.  Assisted with transferring pt to sitting EOB BP decreased to 75/55 with increased c/o dizziness.  Did not attempt any OOB activity and assisted back to supine.  Reported to RN.   Follow Up Recommendations  Home health PT     Equipment Recommendations  Rolling walker with 5" wheels;Wheelchair (measurements PT)    Recommendations for Other Services       Precautions / Restrictions Precautions Precaution Comments: monitor sats Restrictions Weight Bearing Restrictions: No    Mobility  Bed Mobility Overal bed mobility: Modified Independent             General bed mobility comments: increased time   Transfers                    Ambulation/Gait                 Stairs            Wheelchair Mobility    Modified Rankin (Stroke Patients Only)       Balance                                            Cognition Arousal/Alertness: Awake/alert Behavior During Therapy: WFL for tasks assessed/performed Overall Cognitive Status: Within Functional Limits for tasks assessed                                        Exercises      General Comments        Pertinent Vitals/Pain Pain Assessment: No/denies pain    Home Living                      Prior Function            PT Goals (current goals can now be found in the care plan section) Progress towards PT goals:  Progressing toward goals    Frequency    Min 3X/week      PT Plan Current plan remains appropriate    Co-evaluation              AM-PAC PT "6 Clicks" Daily Activity  Outcome Measure  Difficulty turning over in bed (including adjusting bedclothes, sheets and blankets)?: None Difficulty moving from lying on back to sitting on the side of the bed? : None Difficulty sitting down on and standing up from a chair with arms (e.g., wheelchair, bedside commode, etc,.)?: None Help needed moving to and from a bed to chair (including a wheelchair)?: A Little Help needed walking in hospital room?: A Little Help needed climbing 3-5 steps with a railing? : A Lot 6 Click Score: 20    End of Session  Activity Tolerance: Patient limited by fatigue;Other (comment) (low BP) Patient left: in bed;with call bell/phone within reach   PT Visit Diagnosis: Unsteadiness on feet (R26.81);Difficulty in walking, not elsewhere classified (R26.2)     Time: 1342-1400 PT Time Calculation (min) (ACUTE ONLY): 18 min  Charges:  $Therapeutic Activity: 8-22 mins                    G Codes:       Rica Koyanagi  PTA [facility]  Acute  Rehab Pager      [phone]

## 2016-09-12 NOTE — ED Provider Notes (Addendum)
[facility] DEPT Provider Note   CSN: [id number] Arrival date & time: 09/29/2016  2211     History   Chief Complaint Chief Complaint  Patient presents with  . Cardiac Arrest    HPI Hamlet Lasecki is a 66 y.o. male.Seen on arrival level V caveat cardiac arrest EMS was called the patient resident to find him acutely ill. He was talkative upon arrival at 9:10 AM he lost pulses and CPR initiated at 9:14 PM. Initial rhythm per EMS was asystole. CPR was started patient was intubated with [facility] airway and he received epinephrine 5 mg intravenously while in route as well as atropine 1 mg intravenously. Upon arrival CPR was in progress.Marland Kitchen  HPI  Past Medical History:  Diagnosis Date  . COPD (chronic obstructive pulmonary disease) ([place])   . Flexural eczema   . Hypertension   . PAD (peripheral artery disease) ([place])   . Vitamin D deficiency   Tuberculosis  Patient Active Problem List   Diagnosis Date Noted  . Leukocytosis   . Thrush   . Pulmonary TB   . Bacterial pneumonia   . Routine screening for STI (sexually transmitted infection)   . 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 AKI (acute kidney injury) ([place]) 08/14/2016  . Dehydration 09/01/2016  . Cavitary lesion of lung 08/26/2016  . Tobacco abuse 01/02/2012  . Eczema 01/02/2012  . Chronic bronchitis with productive mucopurulent cough ([place]) 01/02/2012  . Hypertension 01/02/2012  . Left ventricular hypertrophy 01/02/2012  . Unexplained weight loss 01/02/2012    Past Surgical History:  Procedure Laterality Date  . VIDEO BRONCHOSCOPY Bilateral 08/29/2016   Procedure: VIDEO BRONCHOSCOPY WITH FLUORO;  Surgeon: Marshell Garfinkel, MD;  Location: [facility] END[facility];  Service: Cardiopulmonary;  Laterality: Bilateral;       Home Medications    Prior to Admission medications   Medication Sig Start Date End Date Taking? Authorizing Provider  budesonide-formoterol (SYMBICORT) 80-4.5 MCG/ACT inhaler Inhale 2 puffs into  the lungs 2 (two) times daily. 07/11/16   Praveen Mannam, MD  carvedilol (COREG) 12.5 MG tablet Take 1 tablet (12.5 mg total) by mouth 2 (two) times daily with a meal. 10/09/2016   Mariel Aloe, MD  D3-50 50000 units capsule Take 50,000 capsules by mouth daily. Tuesday  06/05/16   Historical Provider, MD  ethambutol (MYAMBUTOL) 400 MG tablet Take 800 mg by mouth daily.    Historical Provider, MD  feeding supplement, ENSURE ENLIVE, (ENSURE ENLIVE) LIQD Take 237 mLs by mouth 2 (two) times daily between meals. 10/05/2016   Mariel Aloe, MD  ferrous sulfate 325 (65 FE) MG tablet Take 1 tablet (325 mg total) by mouth 2 (two) times daily with a meal. 09/16/2016   Mariel Aloe, MD  fluconazole (DIFLUCAN) 200 MG tablet Take 1 tablet (200 mg total) by mouth daily. 2016/09/25 09/25/16  Mariel Aloe, MD  guaiFENesin (MUCINEX) 600 MG 12 hr tablet Take 1 tablet (600 mg total) by mouth 2 (two) times daily as needed. 10/07/2016   Mariel Aloe, MD  isoniazid (NYDRAZID) 300 MG tablet Take 300 mg by mouth daily.    Historical Provider, MD  losartan-hydrochlorothiazide (HYZAAR) 50-12.5 MG tablet Take 1 tablet by mouth daily.  07/04/16   Historical Provider, MD  pyrazinamide 500 MG tablet Take 1,000 mg by mouth daily.    Historical Provider, MD  pyridOXINE (VITAMIN B-6) 25 MG tablet Take 25 mg by mouth daily.    Historical Provider, MD  rifampin (RIFADIN) 300 MG capsule  Take 600 mg by mouth daily.    Historical Provider, MD    Family History Family History  Problem Relation Age of Onset  . Diabetes Mother   . Hypertension Mother   . Diabetes Father   . Alcohol abuse Father   . Hypertension Father   . Diabetes Sister   . Hypertension Sister   . Stroke Brother   . Hypertension Brother     Social History Social History  Substance Use Topics  . Smoking status: Former Smoker    Packs/day: 0.50    Years: 40.00    Types: Cigarettes  . Smokeless tobacco: Never Used  . Alcohol use No     Allergies   Patient has no  known allergies.   Review of Systems Review of Systems  Unable to perform ROS: Other   CPR in progress  Physical Exam Updated Vital Signs There were no vitals taken for this visit.  Physical Exam  Constitutional:  Cachectic chronically ill-appearing  HENT:  Head: Normocephalic and atraumatic.  Orally intubated  Eyes:  Pupils fixed bilaterally  Neck: Neck supple.  No signs of trauma  Cardiovascular:  Heart sounds absent  Pulmonary/Chest:  Respirations assisted by bag-valve-mask. No spontaneous respirations  Abdominal: He exhibits no distension.  Musculoskeletal:  All 4 extremities without signs of trauma  Neurological:  Glasgow coma score3 unresponsive  Nursing note reviewed.    ED Treatments / Results  Labs (all labs ordered are listed, but only abnormal results are displayed) Labs Reviewed - No data to display  EKG  EKG Interpretation None       Radiology Dg Chest 2 View  Result Date: 09/11/2016 CLINICAL DATA:  Shortness of breath, known tuberculosis. EXAM: CHEST  2 VIEW COMPARISON:  08/19/2016, 07/11/2016 and CT chest 07/15/2016. FINDINGS: Trachea is midline. Heart size normal. Large relatively thin-walled cavity is again seen in the upper right hemithorax with diffuse micro nodularity in the lungs bilaterally, increased on the left. No definite pleural fluid. 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 Electronically Signed   By: Lorin Picket M.D.   On: 09/11/2016 16:01   Ct Chest Wo Contrast  Result Date: 10/10/2016 CLINICAL DATA:  Pulmonary tuberculosis. EXAM: CT CHEST WITHOUT CONTRAST TECHNIQUE: Multidetector CT imaging of the chest was performed following the standard protocol without IV contrast. COMPARISON:  07/15/2016 chest CT.  Chest x-ray from yesterday FINDINGS: Cardiovascular: Normal heart size. No pericardial effusion. Aortic and coronary  atherosclerosis. Mediastinum/Nodes: Mildly enlarged mediastinal lymph nodes, including 16 mm subcarinal node. Patient is reportedly HIV negative. Lungs/Pleura: Large irregularly-shaped and moderately thick walled cavity centered in the right upper lobe, traversing the major fissure. The cavity measures up to 10 cm in diameter and has mildly increased in size and involvement of the apical lung. Small levels are present. Diffuse tree in bud nodularity with superimposed consolidative areas in the right middle and right lower lobes. There is bronchiectasis and diffuse airway plugging in the right lower lobe. Ground-glass opacity throughout the left lower lobe which is new and different pattern than the airspace disease elsewhere. No effusion. Centrilobular emphysema. Upper Abdomen: Negative Musculoskeletal: Diffuse disc degeneration, which likely accounts for advanced narrowing and endplate irregularity at T8-9. No paravertebral inflammation in this region to suggest an osseous infection. IMPRESSION: 1. Active tuberculosis with ~10 cm thick walled cavity in the right upper lobe crossing through the major fissure. Cavity has mildly enlarged since 07/15/2016. 2. Diffuse tree  in bud and consolidative opacities compatible with endobronchial spread. There has been significant progression in the left lower lobe. Diffuse ground-glass opacity in the left lower lobe which has a different pattern than disease elsewhere, question superimposed pneumonia or alveolar hemorrhage. Electronically Signed   By: Monte Fantasia M.D.   On: 10/06/2016 15:30    Procedures Procedures (including critical care time)  Medications Ordered in ED Medications - No data to display   Initial Impression / Assessment and Plan / ED Course  I have reviewed the triage vital signs and the nursing notes.  Pertinent labs & imaging results that were available during my care of the patient were reviewed by me and considered in my medical decision  making (see chart for details).     CPR was discontinued. Rhythm showed a few agonal ventricular beats minute. To asystole. Patient was pronounced dead by me at 10:11 PM I spoke with medical examiner who declined case Final Clinical Impressions(s) / ED Diagnoses  Diagnosis dead on arrival Final diagnoses:  None  Dr Lonny Prude  contacted by me by telephone and will sign death certificate. Family notified by me  New Prescriptions New Prescriptions   No medications on file     Orlie Dakin, MD 10/09/2016 [place], MD Sep 25, 2016 0020

## 2016-09-12 NOTE — Progress Notes (Signed)
   Facilitated family visitation.  Provided emotional support.   Provided Patient Placement information.  NOK info given to bedside RN.  Donald Prose: Christel Mormon (sister) [phone]  [address] [place] [address] [place], [place] [address]  - Rev. [place] MDiv ThM

## 2016-09-12 NOTE — ED Triage Notes (Signed)
Pt to ED as cardiac arrest. EMS reports being called out for difficulty breathing. When [facility] arrived, EMTs started rescue breathing at that time. EMS started CPR at 2118 with initial rhythm asystole, epi x 6 given with bradycardic rhythm, 1mg  atropine given without improvement. [place] airway and IO in place. Pulses rechecked at 2210, no pulse, TOB at 2211

## 2016-09-13 DIAGNOSIS — R531 Weakness: Secondary | ICD-10-CM | POA: Diagnosis not present

## 2016-09-13 LAB — PATHOLOGIST SMEAR REVIEW

## 2016-09-15 LAB — CULTURE, BLOOD (ROUTINE X 2)
CULTURE: NO GROWTH
CULTURE: NO GROWTH
SPECIAL REQUESTS: ADEQUATE
SPECIAL REQUESTS: ADEQUATE

## 2016-09-16 NOTE — Telephone Encounter (Signed)
Pt is deceased.  Note will be closed.

## 2016-09-18 LAB — AFB CULTURE WITH SMEAR (NOT AT ARMC)

## 2016-09-19 LAB — AFB ORGANISM ID BY DNA PROBE
M AVIUM COMPLEX: NEGATIVE
M tuberculosis complex: POSITIVE — AB

## 2016-09-19 LAB — ACID FAST CULTURE WITH REFLEXED SENSITIVITIES: ACID FAST CULTURE - AFSCU3: POSITIVE — AB

## 2016-09-24 LAB — FUNGAL ORGANISM REFLEX

## 2016-09-24 LAB — FUNGUS CULTURE WITH STAIN

## 2016-09-24 LAB — FUNGUS CULTURE RESULT

## 2016-09-25 LAB — FUNGUS CULTURE W SMEAR

## 2016-09-30 LAB — MTB SUSCEPTIBILITY BROTH, REFLEXED

## 2016-09-30 LAB — AFB ORGANISM ID BY DNA PROBE
M avium complex: NEGATIVE
M tuberculosis complex: POSITIVE — AB

## 2016-09-30 LAB — ACID FAST CULTURE WITH REFLEXED SENSITIVITIES

## 2016-09-30 LAB — ACID FAST CULTURE WITH REFLEXED SENSITIVITIES (MYCOBACTERIA): Acid Fast Culture: POSITIVE — AB

## 2016-10-10 ENCOUNTER — Ambulatory Visit: Payer: Medicare HMO | Admitting: Pulmonary Disease

## 2016-10-11 DIAGNOSIS — 419620001 Death: Secondary | SNOMED CT

## 2016-10-11 NOTE — Progress Notes (Signed)
Late Entry: This CM was contacted by Hoyle Sauer from the [facility] Department on 09/18/2016 at 5:10pm. This CM informed her of the pt need for a CBC draw and follow up next week. Hoyle Sauer states that they would be able to do that for the patient and would follow up with him on his TB treatment.  Marney Doctor RN,BSN,NCM [phone]

## 2016-10-11 DEATH — deceased

## 2019-01-16 IMAGING — DX DG CHEST 2V
2 series · 2 of 2 positions shown · non-contrast
Comparison: 09/06/2016, 07/11/2016 and CT chest 07/15/2016.

CLINICAL DATA: Shortness of breath, known tuberculosis.

EXAM:
CHEST  2 VIEW

[chest pa]
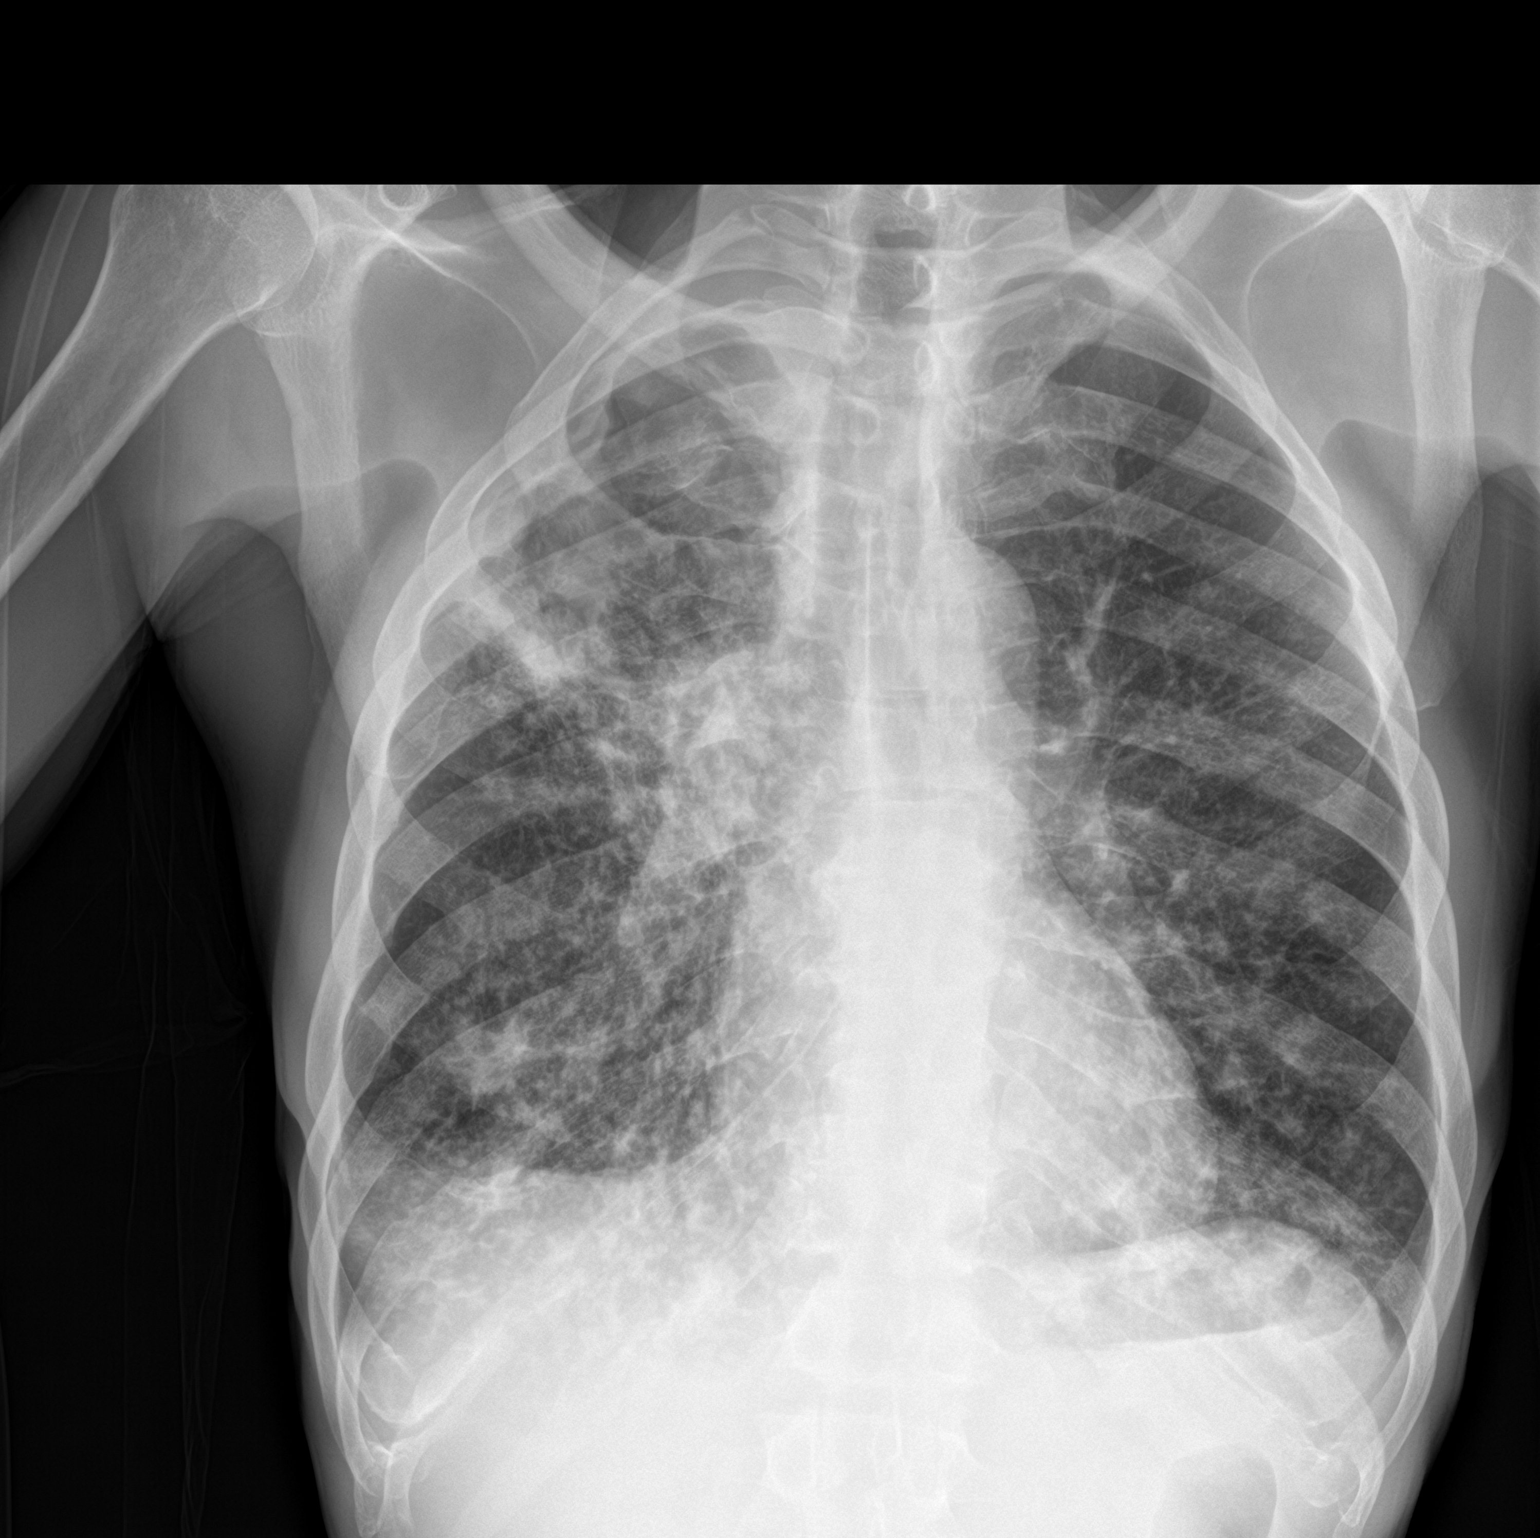

[chest lat]
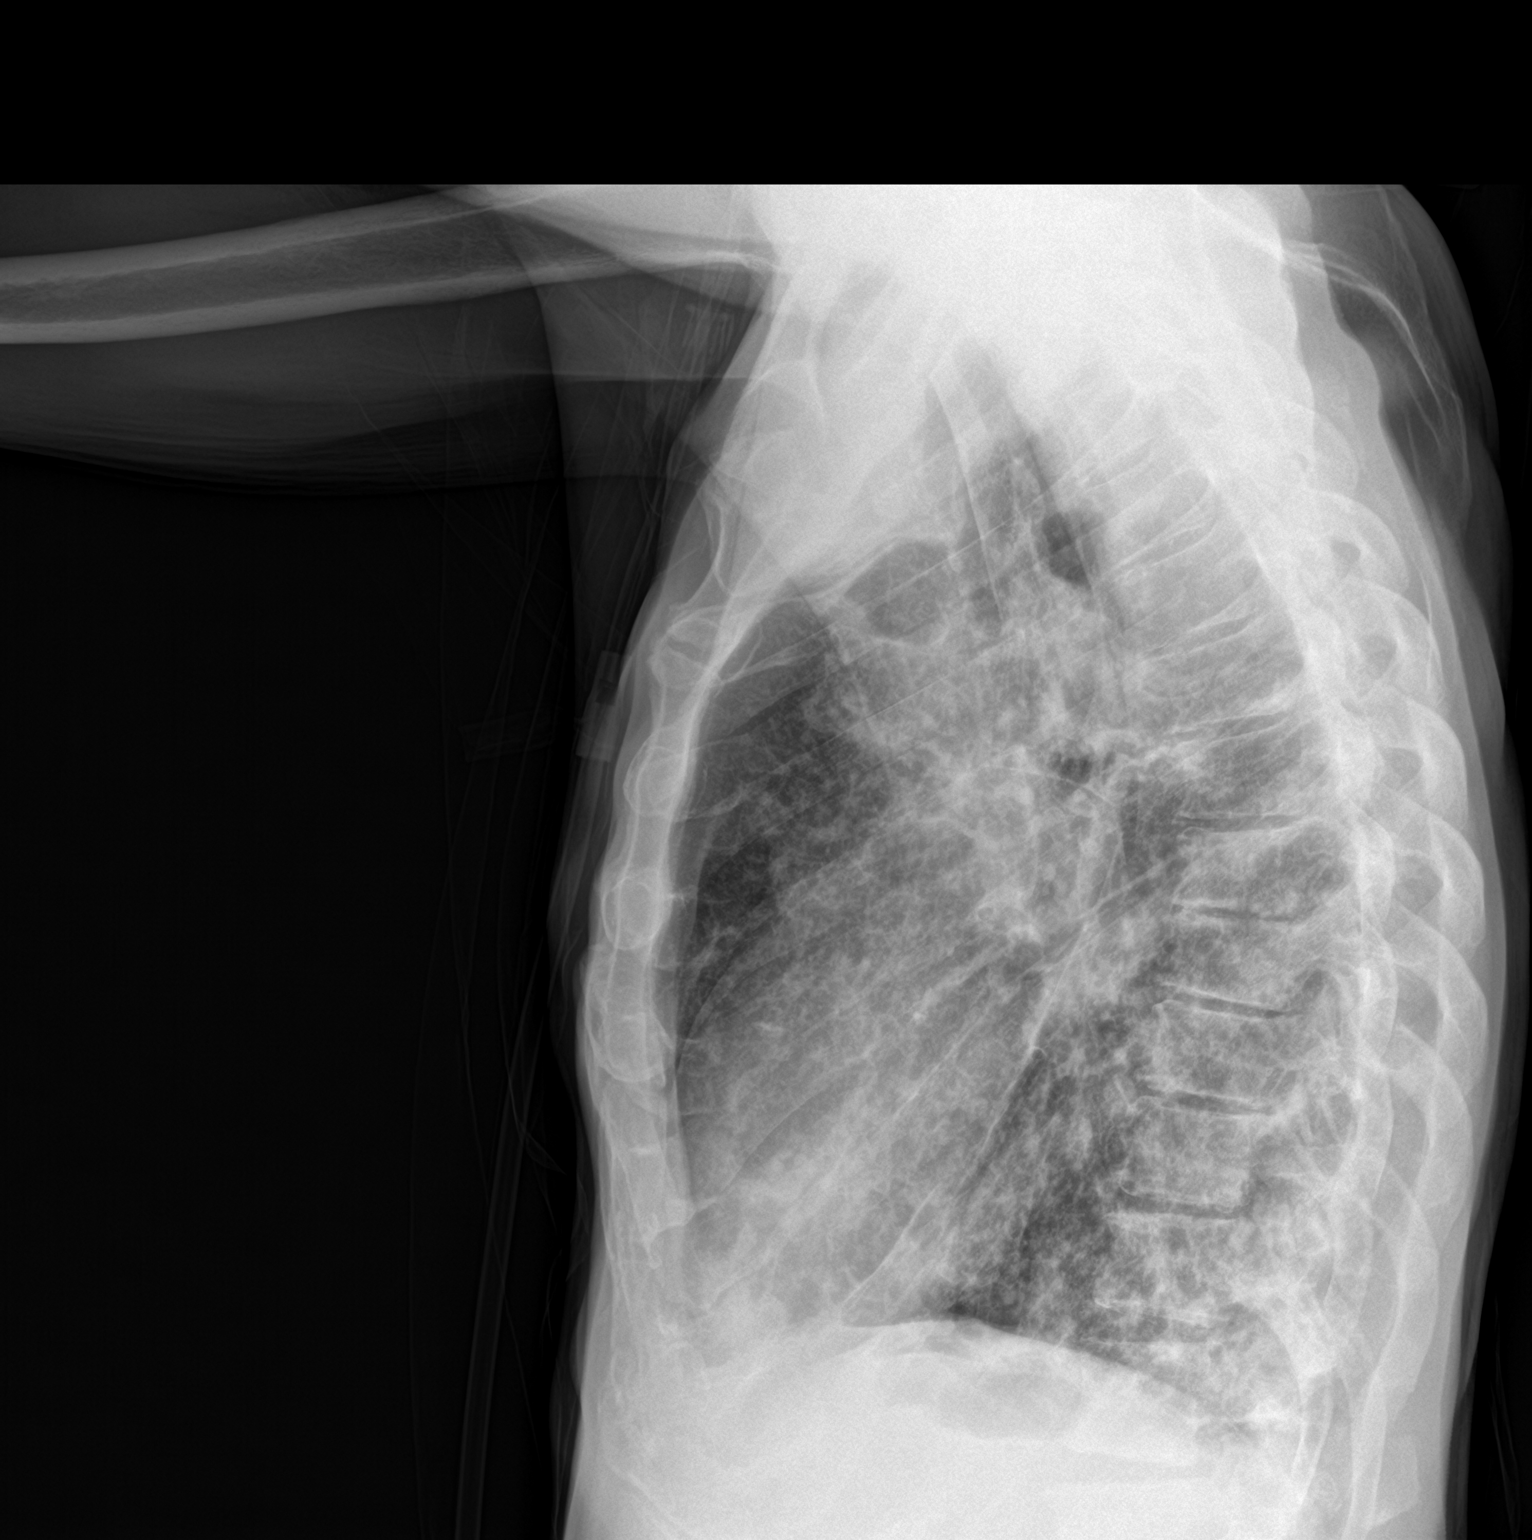

[2 of 2 positions shown; findings below may reference images not displayed]

FINDINGS: Trachea is midline. Heart size normal. Large relatively thin-walled
cavity is again seen in the upper right hemithorax with diffuse
micro nodularity in the lungs bilaterally, increased on the left. No
definite pleural fluid.
IMPRESSION: Pulmonary parenchymal findings consistent with post primary
tuberculosis with increase in endobronchial spread within the left
lung.
